# Patient Record
Sex: Female | Born: 1995 | Race: White | Hispanic: No | Smoking: Never smoker
Health system: Southern US, Community
[De-identification: ages and names within clinical notes are randomized; demographics above are authoritative.]

## PROBLEM LIST (undated history)

## (undated) DIAGNOSIS — Q825 Congenital non-neoplastic nevus: Secondary | ICD-10-CM

## (undated) DIAGNOSIS — K5909 Other constipation: Secondary | ICD-10-CM

## (undated) DIAGNOSIS — T7840XA Allergy, unspecified, initial encounter: Secondary | ICD-10-CM

## (undated) DIAGNOSIS — E559 Vitamin D deficiency, unspecified: Secondary | ICD-10-CM

## (undated) DIAGNOSIS — L309 Dermatitis, unspecified: Secondary | ICD-10-CM

## (undated) DIAGNOSIS — R6251 Failure to thrive (child): Secondary | ICD-10-CM

## (undated) HISTORY — PX: PEG TUBE PLACEMENT: SUR1034

## (undated) HISTORY — DX: Congenital non-neoplastic nevus: Q82.5

## (undated) HISTORY — DX: Dermatitis, unspecified: L30.9

## (undated) HISTORY — DX: Other constipation: K59.09

## (undated) HISTORY — DX: Allergy, unspecified, initial encounter: T78.40XA

## (undated) HISTORY — PX: OTHER SURGICAL HISTORY: SHX169

## (undated) HISTORY — DX: Failure to thrive (child): R62.51

## (undated) HISTORY — DX: Vitamin D deficiency, unspecified: E55.9

## (undated) HISTORY — DX: Hypercalcemia: E83.52

---
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
## 2017-07-28 DIAGNOSIS — Z23 Encounter for immunization: Secondary | ICD-10-CM | POA: Diagnosis not present

## 2019-01-23 ENCOUNTER — Other Ambulatory Visit: Payer: Self-pay

## 2019-01-23 ENCOUNTER — Encounter: Payer: Self-pay | Admitting: Family Medicine

## 2019-01-23 ENCOUNTER — Other Ambulatory Visit (HOSPITAL_COMMUNITY): Admission: RE | Admit: 2019-01-23 | Payer: BLUE CROSS/BLUE SHIELD | Source: Ambulatory Visit

## 2019-01-23 ENCOUNTER — Ambulatory Visit: Payer: BLUE CROSS/BLUE SHIELD | Admitting: Family Medicine

## 2019-01-23 VITALS — BP 118/68 | HR 86 | Temp 98.3°F | Resp 16 | Ht 61.0 in | Wt 142.4 lb

## 2019-01-23 DIAGNOSIS — Z124 Encounter for screening for malignant neoplasm of cervix: Secondary | ICD-10-CM

## 2019-01-23 DIAGNOSIS — Z01419 Encounter for gynecological examination (general) (routine) without abnormal findings: Secondary | ICD-10-CM

## 2019-01-23 DIAGNOSIS — Z131 Encounter for screening for diabetes mellitus: Secondary | ICD-10-CM

## 2019-01-23 DIAGNOSIS — R350 Frequency of micturition: Secondary | ICD-10-CM | POA: Diagnosis not present

## 2019-01-23 DIAGNOSIS — Z13220 Encounter for screening for lipoid disorders: Secondary | ICD-10-CM

## 2019-01-23 DIAGNOSIS — Z3040 Encounter for surveillance of contraceptives, unspecified: Secondary | ICD-10-CM

## 2019-01-23 MED ORDER — MEDROXYPROGESTERONE ACETATE 150 MG/ML IM SUSP: 150.0000 mg | INTRAMUSCULAR | 3 refills | Status: DC

## 2019-01-23 NOTE — Patient Instructions (Signed)
Preventive Care 21-21 Years Old, Female Preventive care refers to visits with your health care provider and lifestyle choices that can promote health and wellness. This includes:  A yearly physical exam. This may also be called an annual well check.  Regular dental visits and eye exams.  Immunizations.  Screening for certain conditions.  Healthy lifestyle choices, such as eating a healthy diet, getting regular exercise, not using drugs or products that contain nicotine and tobacco, and limiting alcohol use. What can I expect for my preventive care visit? Physical exam Your health care provider will check your:  Height and weight. This may be used to calculate body mass index (BMI), which tells if you are at a healthy weight.  Heart rate and blood pressure.  Skin for abnormal spots. Counseling Your health care provider may ask you questions about your:  Alcohol, tobacco, and drug use.  Emotional well-being.  Home and relationship well-being.  Sexual activity.  Eating habits.  Work and work environment.  Method of birth control.  Menstrual cycle.  Pregnancy history. What immunizations do I need?  Influenza (flu) vaccine  This is recommended every year. Tetanus, diphtheria, and pertussis (Tdap) vaccine  You may need a Td booster every 10 years. Varicella (chickenpox) vaccine  You may need this if you have not been vaccinated. Human papillomavirus (HPV) vaccine  If recommended by your health care provider, you may need three doses over 6 months. Measles, mumps, and rubella (MMR) vaccine  You may need at least one dose of MMR. You may also need a second dose. Meningococcal conjugate (MenACWY) vaccine  One dose is recommended if you are age 19-21 years and a first-year college student living in a residence hall, or if you have one of several medical conditions. You may also need additional booster doses. Pneumococcal conjugate (PCV13) vaccine  You may need  this if you have certain conditions and were not previously vaccinated. Pneumococcal polysaccharide (PPSV23) vaccine  You may need one or two doses if you smoke cigarettes or if you have certain conditions. Hepatitis A vaccine  You may need this if you have certain conditions or if you travel or work in places where you may be exposed to hepatitis A. Hepatitis B vaccine  You may need this if you have certain conditions or if you travel or work in places where you may be exposed to hepatitis B. Haemophilus influenzae type b (Hib) vaccine  You may need this if you have certain conditions. You may receive vaccines as individual doses or as more than one vaccine together in one shot (combination vaccines). Talk with your health care provider about the risks and benefits of combination vaccines. What tests do I need?  Blood tests  Lipid and cholesterol levels. These may be checked every 5 years starting at age 20.  Hepatitis C test.  Hepatitis B test. Screening  Diabetes screening. This is done by checking your blood sugar (glucose) after you have not eaten for a while (fasting).  Sexually transmitted disease (STD) testing.  BRCA-related cancer screening. This may be done if you have a family history of breast, ovarian, tubal, or peritoneal cancers.  Pelvic exam and Pap test. This may be done every 3 years starting at age 21. Starting at age 30, this may be done every 5 years if you have a Pap test in combination with an HPV test. Talk with your health care provider about your test results, treatment options, and if necessary, the need for more tests.   Follow these instructions at home: Eating and drinking   Eat a diet that includes fresh fruits and vegetables, whole grains, lean protein, and low-fat dairy.  Take vitamin and mineral supplements as recommended by your health care provider.  Do not drink alcohol if: ? Your health care provider tells you not to drink. ? You are  pregnant, may be pregnant, or are planning to become pregnant.  If you drink alcohol: ? Limit how much you have to 0-1 drink a day. ? Be aware of how much alcohol is in your drink. In the [place] one drink equals one 12 oz bottle of beer (355 mL), one 5 oz glass of wine (148 mL), or one 1 oz glass of hard liquor (44 mL). Lifestyle  Take daily care of your teeth and gums.  Stay active. Exercise for at least 30 minutes on 5 or more days each week.  Do not use any products that contain nicotine or tobacco, such as cigarettes, e-cigarettes, and chewing tobacco. If you need help quitting, ask your health care provider.  If you are sexually active, practice safe sex. Use a condom or other form of birth control (contraception) in order to prevent pregnancy and STIs (sexually transmitted infections). If you plan to become pregnant, see your health care provider for a preconception visit. What's next?  Visit your health care provider once a year for a well check visit.  Ask your health care provider how often you should have your eyes and teeth checked.  Stay up to date on all vaccines. This information is not intended to replace advice given to you by your health care provider. Make sure you discuss any questions you have with your health care provider. Document Released: 09/11/2001 Document Revised: 03/27/2018 Document Reviewed: 03/27/2018 Elsevier Patient Education  2020 Elsevier Inc.  

## 2019-01-23 NOTE — Progress Notes (Signed)
Name: [facility]   MRN: [id number]    DOB: [birth year]   Date:01/23/2019       Progress Note  Subjective  Chief Complaint  Chief Complaint  Patient presents with  . Contraception    HPI   Patient presents for annual CPE   Diet: diet is not balanced mostly fast food for lunch and dinner, discussed healthy diet  Exercise: not physical active   USPSTF grade A and B recommendations    [facility] Visit from [facility]/23/2019 in [facility]  AUDIT-C Score  5     Depression: Phq 9 is  negative Depression screen [facility] 2/9 01/23/2019 08/01/2016 11/29/2015 06/22/2015 06/13/2015  Decreased Interest 0 0 0 0 0  Down, Depressed, Hopeless 0 0 0 0 0  PHQ - 2 Score 0 0 0 0 0  Altered sleeping 0 - - - -  Tired, decreased energy 0 - - - -  Change in appetite 0 - - - -  Feeling bad or failure about yourself  0 - - - -  Trouble concentrating 0 - - - -  Moving slowly or fidgety/restless 0 - - - -  Suicidal thoughts 0 - - - -  PHQ-9 Score 0 - - - -  Difficult doing work/chores Not difficult at all - - - -   Hypertension: BP Readings from Last 3 Encounters:  01/23/19 118/68  08/01/16 114/76  11/29/15 (!) 98/52 (11 %, Z = -1.23 /  9 %, Z = -1.32)*   *BP percentiles are based on the 2017 AAP Clinical Practice Guideline for girls   Obesity: Wt Readings from Last 3 Encounters:  01/23/19 142 lb 6.4 oz (64.6 kg)  08/01/16 114 lb 14.4 oz (52.1 kg) (28 %, Z= -0.58)*  11/29/15 107 lb 8 oz (48.8 kg) (16 %, Z= -0.99)*   * Growth percentiles are based on CDC (Girls, 2-20 Years) data.   BMI Readings from Last 3 Encounters:  01/23/19 26.91 kg/m  08/01/16 21.71 kg/m (53 %, Z= 0.09)*  11/29/15 20.31 kg/m (38 %, Z= -0.31)*   * Growth percentiles are based on CDC (Girls, 2-20 Years) data.    Hep C Screening: refused STD testing and prevention (HIV/chl/gon/syphilis): just GC today , she is worried about cost of test, advised to go to free testing site  Intimate partner violence:  negative screen  Sexual History/Pain during Intercourse: no pain or bleeding with intercourse  Menstrual History/LMP/Abnormal Bleeding: menarche age 20, regular cycles, used to be on Depo and would like to resume it  Incontinence Symptoms: she has urinary urgency lately and would like to be checked for an uti   Advanced Care Planning: A voluntary discussion about advance care planning including the explanation and discussion of advance directives.  Discussed health care proxy and Living will, and the patient was able to identify a health care proxy as mother .     BRCA gene screening: discussed and she will think about it, she was adopted  Cervical cancer screening: today   Osteoporosis Screening: discussed high calcium and vitamin D   Glucose:  Glucose  Date Value Ref Range Status  04/07/2015 91 65 - 99 mg/dL Final   Glucose, Bld  Date Value Ref Range Status  11/23/2015 84 65 - 99 mg/dL Final    Skin cancer: discussed atypical lesions    Patient Active Problem List   Diagnosis Date Noted  . Syncope and collapse 12/15/2015  . Chronic constipation 01/10/2015  . Dermatitis, eczematoid  01/10/2015  . Perennial allergic rhinitis 01/10/2015  . Vitamin D deficiency 01/10/2015  . H/O urinary disorder 02/02/2008    Past Surgical History:  Procedure Laterality Date  . cranial synostosis repair  2001  . PEG TUBE PLACEMENT      Family History  Adopted: Yes    Social History   Socioeconomic History  . Marital status: Single    Spouse name: Not on file  . Number of children: 0  . Years of education: 24  . Highest education level: Some college, no degree  Occupational History  . Occupation: fast Therapist, occupational   Social Needs  . Financial resource strain: Not hard at all  . Food insecurity    Worry: Never true    Inability: Never true  . Transportation needs    Medical: No    Non-medical: No  Tobacco Use  . Smoking status: Never Smoker  . Smokeless tobacco: Never Used   Substance and Sexual Activity  . Alcohol use: Yes    Alcohol/week: 3.0 standard drinks    Types: 3 Standard drinks or equivalent per week  . Drug use: Yes    Frequency: 2.0 times per week    Types: Marijuana  . Sexual activity: Yes    Partners: Male    Birth control/protection: Condom  Lifestyle  . Physical activity    Days per week: 0 days    Minutes per session: 0 min  . Stress: Not at all  Relationships  . Social connections    Talks on phone: More than three times a week    Gets together: More than three times a week    Attends religious service: Never    Active member of club or organization: Yes    Attends meetings of clubs or organizations: More than 4 times per year    Relationship status: Never married  . Intimate partner violence    Fear of current or ex partner: No    Emotionally abused: No    Physically abused: No    Forced sexual activity: No  Other Topics Concern  . Not on file  Social History Narrative   Graduates next May 2021 from [employer] in [place].    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  [employer]S VITAMIN D 2000 UNITS CAPS, Take 1 capsule by mouth daily. Reported on 11/29/2015, Disp: , Rfl: 5 .  fluticasone (FLONASE) 50 MCG/ACT nasal spray, Place 2 sprays into both nostrils at bedtime. Reported on 11/29/2015, Disp: , Rfl: 0 .  loratadine (CLARITIN) 10 MG tablet, Take 10 mg by mouth daily. Reported on 11/29/2015, Disp: , Rfl: 2 .  medroxyPROGESTERone (DEPO-PROVERA) 150 MG/ML injection, Inject 1 mL (150 mg total) into the muscle every 3 (three) months., Disp: 1 mL, Rfl: 3  No Known Allergies   ROS  Constitutional: Negative for fever or weight change.  Respiratory: Negative for cough and shortness of breath.   Cardiovascular: Negative for chest pain or palpitations.  Gastrointestinal: Negative for abdominal pain, no bowel changes.  Musculoskeletal: Negative for gait problem or joint swelling.  Skin:  Negative for rash.  Neurological: Negative for dizziness or headache.  No other specific complaints in a complete review of systems (except as listed in HPI above).  Objective  Vitals:   01/23/19 1528  BP: 118/68  Pulse: 86  Resp: 16  Temp: 98.3 F (36.8 C)  TempSrc: Oral  SpO2: 98%  Weight: 142 lb  6.4 oz (64.6 kg)  Height: '5\' 1"'  (1.549 m)    Body mass index is 26.91 kg/m.  Physical Exam  Constitutional: Patient appears well-developed and well-nourished. No distress.  HENT: Head: Normocephalic and atraumatic. Ears: B TMs ok, no erythema or effusion; Nose: Nose normal. Mouth/Throat: Oropharynx is clear and moist. No oropharyngeal exudate.  Eyes: Conjunctivae and EOM are normal. Pupils are equal, round, and reactive to light. No scleral icterus.  Neck: Normal range of motion. Neck supple. No JVD present. No thyromegaly present.  Cardiovascular: Normal rate, regular rhythm and normal heart sounds.  No murmur heard. No BLE edema. Pulmonary/Chest: Effort normal and breath sounds normal. No respiratory distress. Abdominal: Soft. Bowel sounds are normal, no distension. There is no tenderness. no masses Breast: no lumps or masses, no nipple discharge or rashes FEMALE GENITALIA:  External genitalia normal External urethra normal Vaginal vault normal without discharge or lesions Cervix normal without discharge or lesions Bimanual exam normal without masses RECTAL: not  done  Musculoskeletal: Normal range of motion, no joint effusions. No gross deformities Neurological: he is alert and oriented to person, place, and time. No cranial nerve deficit. Coordination, balance, strength, speech and gait are normal.  Skin: Skin is warm and dry. No rash noted. No erythema.  Psychiatric: Patient has a normal mood and affect. behavior is normal. Judgment and thought content normal.  PHQ2/9: Depression screen [facility] 2/9 01/23/2019 08/01/2016 11/29/2015 06/22/2015 06/13/2015  Decreased Interest 0 0 0 0  0  Down, Depressed, Hopeless 0 0 0 0 0  PHQ - 2 Score 0 0 0 0 0  Altered sleeping 0 - - - -  Tired, decreased energy 0 - - - -  Change in appetite 0 - - - -  Feeling bad or failure about yourself  0 - - - -  Trouble concentrating 0 - - - -  Moving slowly or fidgety/restless 0 - - - -  Suicidal thoughts 0 - - - -  PHQ-9 Score 0 - - - -  Difficult doing work/chores Not difficult at all - - - -     Fall Risk: Fall Risk  01/23/2019 08/01/2016 11/29/2015 06/22/2015 06/13/2015  Falls in the past year? 0 No No No No  Number falls in past yr: 0 - - - -  Injury with Fall? 0 - - - -     Functional Status Survey: Is the patient deaf or have difficulty hearing?: No Does the patient have difficulty seeing, even when wearing glasses/contacts?: No Does the patient have difficulty concentrating, remembering, or making decisions?: No Does the patient have difficulty walking or climbing stairs?: No Does the patient have difficulty dressing or bathing?: No Does the patient have difficulty doing errands alone such as visiting a doctor's office or shopping?: No  Assessment & Plan   1. Well woman exam  - Lipid panel - Hemoglobin A1c - CBC with Differential/Platelet - COMPLETE METABOLIC PANEL WITH GFR  2. Cervical cancer screening  - Cytology - PAP  3. Screening cholesterol level  - Hemoglobin A1c  4. Diabetes mellitus screening  - Lipid panel  5. Encounter for surveillance of contraceptives  - medroxyPROGESTERone (DEPO-PROVERA) 150 MG/ML injection; Inject 1 mL (150 mg total) into the muscle every 3 (three) months.  Dispense: 1 mL; Refill: 3  6. Urinary frequency  - CULTURE, URINE COMPREHENSIVE  -USPSTF grade A and B recommendations reviewed with patient; age-appropriate recommendations, preventive care, screening tests, etc discussed and encouraged; healthy living encouraged; see AVS  for patient education given to patient -Discussed importance of 150 minutes of physical activity  weekly, eat two servings of fish weekly, eat one serving of tree nuts ( cashews, pistachios, pecans, almonds.Marland Kitchen) every other day, eat 6 servings of fruit/vegetables daily and drink plenty of water and avoid sweet beverages.

## 2019-01-25 LAB — CBC WITH DIFFERENTIAL/PLATELET
Absolute Monocytes: 1062 cells/uL — ABNORMAL HIGH (ref 200–950)
Basophils Absolute: 77 cells/uL (ref 0–200)
Basophils Relative: 0.6 %
Eosinophils Absolute: 154 cells/uL (ref 15–500)
Eosinophils Relative: 1.2 %
HCT: 42.4 % (ref 35.0–45.0)
Hemoglobin: 14.4 g/dL (ref 11.7–15.5)
Lymphs Abs: 3046 cells/uL (ref 850–3900)
MCH: 30.4 pg (ref 27.0–33.0)
MCHC: 34 g/dL (ref 32.0–36.0)
MCV: 89.6 fL (ref 80.0–100.0)
MPV: 11.2 fL (ref 7.5–12.5)
Monocytes Relative: 8.3 %
Neutro Abs: 8461 cells/uL — ABNORMAL HIGH (ref 1500–7800)
Neutrophils Relative %: 66.1 %
Platelets: 321 10*3/uL (ref 140–400)
RBC: 4.73 10*6/uL (ref 3.80–5.10)
RDW: 11.4 % (ref 11.0–15.0)
Total Lymphocyte: 23.8 %
WBC: 12.8 10*3/uL — ABNORMAL HIGH (ref 3.8–10.8)

## 2019-01-25 LAB — CULTURE, URINE COMPREHENSIVE
MICRO NUMBER:: 612507
SPECIMEN QUALITY:: ADEQUATE

## 2019-01-25 LAB — COMPLETE METABOLIC PANEL WITH GFR
AG Ratio: 1.8 (calc) (ref 1.0–2.5)
ALT: 14 U/L (ref 6–29)
AST: 15 U/L (ref 10–30)
Albumin: 4.7 g/dL (ref 3.6–5.1)
Alkaline phosphatase (APISO): 60 U/L (ref 31–125)
BUN: 11 mg/dL (ref 7–25)
CO2: 23 mmol/L (ref 20–32)
Calcium: 9.7 mg/dL (ref 8.6–10.2)
Chloride: 106 mmol/L (ref 98–110)
Creat: 0.84 mg/dL (ref 0.50–1.10)
GFR, Est African American: 115 mL/min/{1.73_m2} (ref >=60)
GFR, Est Non African American: 99 mL/min/{1.73_m2} (ref >=60)
Globulin: 2.6 g/dL (calc) (ref 1.9–3.7)
Glucose, Bld: 77 mg/dL (ref 65–99)
Potassium: 4.1 mmol/L (ref 3.5–5.3)
Sodium: 139 mmol/L (ref 135–146)
Total Bilirubin: 0.5 mg/dL (ref 0.2–1.2)
Total Protein: 7.3 g/dL (ref 6.1–8.1)

## 2019-01-25 LAB — LIPID PANEL
Cholesterol: 155 mg/dL (ref <200)
HDL: 65 mg/dL (ref >=50)
LDL Cholesterol (Calc): 76 mg/dL (calc)
Non-HDL Cholesterol (Calc): 90 mg/dL (calc) (ref <130)
Total CHOL/HDL Ratio: 2.4 (calc) (ref <5.0)
Triglycerides: 64 mg/dL (ref <150)

## 2019-01-25 LAB — HEMOGLOBIN A1C
Hgb A1c MFr Bld: 4.8 % of total Hgb (ref <5.7)
Mean Plasma Glucose: 91 (calc)
eAG (mmol/L): 5 (calc)

## 2019-01-26 ENCOUNTER — Other Ambulatory Visit: Payer: Self-pay | Admitting: Family Medicine

## 2019-01-26 DIAGNOSIS — D72829 Elevated white blood cell count, unspecified: Secondary | ICD-10-CM

## 2019-01-26 DIAGNOSIS — R350 Frequency of micturition: Secondary | ICD-10-CM | POA: Diagnosis not present

## 2019-01-27 LAB — CYTOLOGY - PAP
Chlamydia: NEGATIVE
Diagnosis: NEGATIVE
Neisseria Gonorrhea: NEGATIVE

## 2019-01-28 LAB — CULTURE, URINE COMPREHENSIVE
MICRO NUMBER:: 619490
SPECIMEN QUALITY:: ADEQUATE

## 2019-02-03 ENCOUNTER — Other Ambulatory Visit: Payer: Self-pay | Admitting: Family Medicine

## 2019-02-03 DIAGNOSIS — D72829 Elevated white blood cell count, unspecified: Secondary | ICD-10-CM | POA: Diagnosis not present

## 2019-02-03 LAB — CBC WITH DIFFERENTIAL/PLATELET
Absolute Monocytes: 446 cells/uL (ref 200–950)
Basophils Absolute: 29 cells/uL (ref 0–200)
Basophils Relative: 0.6 %
Eosinophils Absolute: 59 cells/uL (ref 15–500)
Eosinophils Relative: 1.2 %
HCT: 36.7 % (ref 35.0–45.0)
Hemoglobin: 12.5 g/dL (ref 11.7–15.5)
Lymphs Abs: 1960 cells/uL (ref 850–3900)
MCH: 31 pg (ref 27.0–33.0)
MCHC: 34.1 g/dL (ref 32.0–36.0)
MCV: 91.1 fL (ref 80.0–100.0)
MPV: 12 fL (ref 7.5–12.5)
Monocytes Relative: 9.1 %
Neutro Abs: 2406 cells/uL (ref 1500–7800)
Neutrophils Relative %: 49.1 %
Platelets: 243 10*3/uL (ref 140–400)
RBC: 4.03 10*6/uL (ref 3.80–5.10)
RDW: 12 % (ref 11.0–15.0)
Total Lymphocyte: 40 %
WBC: 4.9 10*3/uL (ref 3.8–10.8)

## 2019-03-03 ENCOUNTER — Ambulatory Visit: Payer: BC Managed Care – PPO | Admitting: Family Medicine

## 2019-07-17 ENCOUNTER — Encounter: Payer: Self-pay | Admitting: Family Medicine

## 2019-07-17 ENCOUNTER — Other Ambulatory Visit: Payer: Self-pay

## 2019-07-17 ENCOUNTER — Ambulatory Visit: Payer: BC Managed Care – PPO

## 2019-07-17 DIAGNOSIS — Z3042 Encounter for surveillance of injectable contraceptive: Secondary | ICD-10-CM | POA: Diagnosis not present

## 2019-07-17 MED ORDER — MEDROXYPROGESTERONE ACETATE 150 MG/ML IM SUSP
150.0000 mg | INTRAMUSCULAR | Status: DC
  Administered 2019-07-17: 150 mg via INTRAMUSCULAR

## 2019-07-17 NOTE — Progress Notes (Signed)
Patient is here for her depo injection. Her last injection was done on September 29 at [facility]. She is on time for next injection no urine pregnancy test was needed.  Her next injection will be due March 5-19. She tolerated injection well.

## 2019-10-16 ENCOUNTER — Ambulatory Visit (INDEPENDENT_AMBULATORY_CARE_PROVIDER_SITE_OTHER): Payer: BC Managed Care – PPO

## 2019-10-16 ENCOUNTER — Other Ambulatory Visit: Payer: Self-pay

## 2019-10-16 DIAGNOSIS — Z3042 Encounter for surveillance of injectable contraceptive: Secondary | ICD-10-CM

## 2019-10-16 MED ORDER — MEDROXYPROGESTERONE ACETATE 150 MG/ML IM SUSY
150.0000 mg | PREFILLED_SYRINGE | INTRAMUSCULAR | Status: AC
Start: 2019-10-16 — End: 2019-10-16
  Administered 2019-10-16: 150 mg via INTRAMUSCULAR

## 2019-10-16 NOTE — Progress Notes (Signed)
Patient is here for her depo injection. Her last injection was done on July 17, 2019 at [facility] . She is on time for next injection no urine pregnancy test was needed.  Her next injection will be due June 4-June 18 She tolerated injection well.

## 2020-01-04 ENCOUNTER — Other Ambulatory Visit: Payer: Self-pay | Admitting: Family Medicine

## 2020-01-04 DIAGNOSIS — Z3040 Encounter for surveillance of contraceptives, unspecified: Secondary | ICD-10-CM

## 2020-01-07 ENCOUNTER — Ambulatory Visit: Payer: BC Managed Care – PPO

## 2020-01-07 ENCOUNTER — Ambulatory Visit: Payer: Self-pay

## 2020-01-08 ENCOUNTER — Ambulatory Visit (INDEPENDENT_AMBULATORY_CARE_PROVIDER_SITE_OTHER): Payer: BC Managed Care – PPO

## 2020-01-08 ENCOUNTER — Other Ambulatory Visit: Payer: Self-pay

## 2020-01-08 DIAGNOSIS — Z3042 Encounter for surveillance of injectable contraceptive: Secondary | ICD-10-CM

## 2020-01-08 MED ORDER — MEDROXYPROGESTERONE ACETATE 150 MG/ML IM SUSP
150.0000 mg | Freq: Once | INTRAMUSCULAR | Status: AC
  Administered 2020-01-08: 150 mg via INTRAMUSCULAR

## 2020-01-21 ENCOUNTER — Other Ambulatory Visit: Payer: Self-pay | Admitting: Family Medicine

## 2020-01-21 DIAGNOSIS — D72829 Elevated white blood cell count, unspecified: Secondary | ICD-10-CM

## 2020-01-30 ENCOUNTER — Encounter: Payer: Self-pay | Admitting: Emergency Medicine

## 2020-01-30 ENCOUNTER — Emergency Department: Payer: BC Managed Care – PPO

## 2020-01-30 ENCOUNTER — Other Ambulatory Visit: Payer: Self-pay

## 2020-01-30 DIAGNOSIS — W1839XA Other fall on same level, initial encounter: Secondary | ICD-10-CM | POA: Diagnosis not present

## 2020-01-30 DIAGNOSIS — Y999 Unspecified external cause status: Secondary | ICD-10-CM | POA: Insufficient documentation

## 2020-01-30 DIAGNOSIS — Z5321 Procedure and treatment not carried out due to patient leaving prior to being seen by health care provider: Secondary | ICD-10-CM | POA: Diagnosis not present

## 2020-01-30 DIAGNOSIS — S0993XA Unspecified injury of face, initial encounter: Secondary | ICD-10-CM | POA: Diagnosis not present

## 2020-01-30 DIAGNOSIS — Y939 Activity, unspecified: Secondary | ICD-10-CM | POA: Insufficient documentation

## 2020-01-30 DIAGNOSIS — S0990XA Unspecified injury of head, initial encounter: Secondary | ICD-10-CM | POA: Diagnosis not present

## 2020-01-30 DIAGNOSIS — Y929 Unspecified place or not applicable: Secondary | ICD-10-CM | POA: Diagnosis not present

## 2020-01-30 DIAGNOSIS — S80211A Abrasion, right knee, initial encounter: Secondary | ICD-10-CM | POA: Insufficient documentation

## 2020-01-30 DIAGNOSIS — S0081XA Abrasion of other part of head, initial encounter: Secondary | ICD-10-CM | POA: Diagnosis not present

## 2020-01-30 DIAGNOSIS — S199XXA Unspecified injury of neck, initial encounter: Secondary | ICD-10-CM | POA: Diagnosis not present

## 2020-01-30 DIAGNOSIS — S80212A Abrasion, left knee, initial encounter: Secondary | ICD-10-CM | POA: Diagnosis not present

## 2020-01-30 LAB — CBC
HCT: 40.5 % (ref 36.0–46.0)
Hemoglobin: 14 g/dL (ref 12.0–15.0)
MCH: 30.2 pg (ref 26.0–34.0)
MCHC: 34.6 g/dL (ref 30.0–36.0)
MCV: 87.3 fL (ref 80.0–100.0)
Platelets: 341 10*3/uL (ref 150–400)
RBC: 4.64 MIL/uL (ref 3.87–5.11)
RDW: 11.5 % (ref 11.5–15.5)
WBC: 11.6 10*3/uL — ABNORMAL HIGH (ref 4.0–10.5)
nRBC: 0 % (ref 0.0–0.2)

## 2020-01-30 LAB — BASIC METABOLIC PANEL
Anion gap: 10 (ref 5–15)
BUN: 12 mg/dL (ref 6–20)
CO2: 20 mmol/L — ABNORMAL LOW (ref 22–32)
Calcium: 9.4 mg/dL (ref 8.9–10.3)
Chloride: 106 mmol/L (ref 98–111)
Creatinine, Ser: 0.85 mg/dL (ref 0.44–1.00)
GFR calc Af Amer: >60 mL/min (ref >60)
GFR calc non Af Amer: >60 mL/min (ref >60)
Glucose, Bld: 94 mg/dL (ref 70–99)
Potassium: 3.5 mmol/L (ref 3.5–5.1)
Sodium: 136 mmol/L (ref 135–145)

## 2020-01-30 LAB — GLUCOSE, CAPILLARY: Glucose-Capillary: 87 mg/dL (ref 70–99)

## 2020-01-30 NOTE — ED Triage Notes (Addendum)
Pt arrived via POV with reports of hanging out with friends today, states she drank 2 glasses of wine, prior to arrival she states she passed out and fell forward while standing on cement. Pt has abrasions to forehead and bilateral knees.  Denies any vomiting or nausea.  Pt alert and oriented at this time, speech clear.

## 2020-01-30 NOTE — ED Notes (Signed)
Patient transported to CT 

## 2020-01-30 NOTE — ED Notes (Signed)
Pt unable to give urine sample at this time; pt has specimen cup. Gave pt ice pack for head.

## 2020-01-30 NOTE — ED Notes (Signed)
Pt told screener that she would be leaving and did not want to stay for further treatment at this time. Pt ambulatory out to parking lot without difficulty.

## 2020-01-31 ENCOUNTER — Emergency Department
Admission: EM | Admit: 2020-01-31 | Discharge: 2020-01-31 | Disposition: A | Discharge status: Home / Self Care | Payer: BC Managed Care – PPO | Attending: Emergency Medicine | Admitting: Emergency Medicine

## 2020-03-31 ENCOUNTER — Telehealth: Payer: Self-pay | Admitting: Family Medicine

## 2020-03-31 DIAGNOSIS — Z3040 Encounter for surveillance of contraceptives, unspecified: Secondary | ICD-10-CM

## 2020-03-31 NOTE — Telephone Encounter (Signed)
Requested  medications are  due for refill today yes  Requested medications are on the active medication list yes  Last refill 6/7  Last visit June 2020  Future visit scheduled No  Notes to clinic failed protocol of visit within 12 months

## 2020-04-06 NOTE — Telephone Encounter (Signed)
lvm informing pt that prescription has been sent to pharmacy also for pt to schedule an appt. Will try to get her in next Thursday if pt call back in time

## 2020-08-22 NOTE — Progress Notes (Unsigned)
Name: Sandra Hayes [facility]   MRN: [id number]    DOB: 04-27-1996   Date:08/23/2020       Progress Note  Subjective  Chief Complaint  Chief Complaint  Patient presents with  . Sore Throat  . Nasal Congestion  . Contraception    HPI  Patient presents for annual CPE.  Diet: balanced and enough calcium Exercise: physical job  Haematologist Visit from 08/23/2020 in [facility]  AUDIT-C Score 0     Depression: Phq 9 is  negative Depression screen [facility] 2/9 08/23/2020 01/23/2019 08/01/2016 11/29/2015 06/22/2015  Decreased Interest 0 0 0 0 0  Down, Depressed, Hopeless 0 0 0 0 0  PHQ - 2 Score 0 0 0 0 0  Altered sleeping - 0 - - -  Tired, decreased energy - 0 - - -  Change in appetite - 0 - - -  Feeling bad or failure about yourself  - 0 - - -  Trouble concentrating - 0 - - -  Moving slowly or fidgety/restless - 0 - - -  Suicidal thoughts - 0 - - -  PHQ-9 Score - 0 - - -  Difficult doing work/chores - Not difficult at all - - -   Hypertension: BP Readings from Last 3 Encounters:  08/23/20 90/62  01/30/20 139/78  01/23/19 118/68   Obesity: Wt Readings from Last 3 Encounters:  08/23/20 119 lb 8 oz (54.2 kg)  01/30/20 140 lb (63.5 kg)  01/23/19 142 lb 6.4 oz (64.6 kg)   BMI Readings from Last 3 Encounters:  08/23/20 21.86 kg/m  01/30/20 25.61 kg/m  01/23/19 26.91 kg/m     Vaccines:   Tdap today  Flu: today   Hep C Screening: today  STD testing and prevention (HIV/chl/gon/syphilis): today  Intimate partner violence:negative screen  Sexual History : same sexual partner, female, past year  Menstrual History/LMP/Abnormal Bleeding: taking depo , last dose 4 months ago, pregnancy test today  Incontinence Symptoms: none    Osteoporosis: Discussed high calcium and vitamin D supplementation, weight bearing exercises  Cervical cancer screening: today   Skin cancer: Discussed monitoring for atypical lesions   Advanced Care Planning: A voluntary  discussion about advance care planning including the explanation and discussion of advance directives.  Discussed health care proxy and Living will, and the patient was able to identify a health care proxy as mother .  Patient does not have a living will at present time.  Lipids: Lab Results  Component Value Date   CHOL 155 01/23/2019   CHOL 121 04/07/2015   Lab Results  Component Value Date   HDL 65 01/23/2019   Lab Results  Component Value Date   LDLCALC 76 01/23/2019   Lab Results  Component Value Date   TRIG 64 01/23/2019   Lab Results  Component Value Date   CHOLHDL 2.4 01/23/2019   No results found for: LDLDIRECT  Glucose: Glucose, Bld  Date Value Ref Range Status  01/30/2020 94 70 - 99 mg/dL Final    Comment:    Glucose reference range applies only to samples taken after fasting for at least 8 hours.  01/23/2019 77 65 - 99 mg/dL Final    Comment:    .            Fasting reference interval .   11/23/2015 84 65 - 99 mg/dL Final   Glucose-Capillary  Date Value Ref Range Status  01/30/2020 87 70 - 99 mg/dL Final  Comment:    Glucose reference range applies only to samples taken after fasting for at least 8 hours.    Patient Active Problem List   Diagnosis Date Noted  . Syncope and collapse 12/15/2015  . Chronic constipation 01/10/2015  . Dermatitis, eczematoid 01/10/2015  . Perennial allergic rhinitis 01/10/2015  . Vitamin D deficiency 01/10/2015  . H/O urinary disorder 02/02/2008    Past Surgical History:  Procedure Laterality Date  . cranial synostosis repair  2001  . PEG TUBE PLACEMENT      Family History  Adopted: Yes    Social History   Socioeconomic History  . Marital status: Media planner    Spouse name: Not on file  . Number of children: 0  . Years of education: 64  . Highest education level: Some college, no degree  Occupational History  . Occupation: wear house stocker   Tobacco Use  . Smoking status: Never Smoker  .  Smokeless tobacco: Never Used  Vaping Use  . Vaping Use: Never used  Substance and Sexual Activity  . Alcohol use: Yes    Alcohol/week: 3.0 standard drinks    Types: 3 Standard drinks or equivalent per week  . Drug use: Yes    Frequency: 2.0 times per week    Types: Marijuana  . Sexual activity: Yes    Partners: Male    Birth control/protection: Condom, Injection  Other Topics Concern  . Not on file  Social History Narrative   Graduates next May 2021 from [employer] in [place].    Applying for [employer], wants to do Speech pathology at [employer]   Social Determinants of Health   Financial Resource Strain: Not on file  Food Insecurity: No Food Insecurity  . Worried About Programme researcher, broadcasting/film/video in the Last Year: Never true  . Ran Out of Food in the Last Year: Never true  Transportation Needs: No Transportation Needs  . Lack of Transportation (Medical): No  . Lack of Transportation (Non-Medical): No  Physical Activity: Sufficiently Active  . Days of Exercise per Week: 5 days  . Minutes of Exercise per Session: 150+ min  Stress: Not on file  Social Connections: Not on file  Intimate Partner Violence: Not At Risk  . Fear of Current or Ex-Partner: No  . Emotionally Abused: No  . Physically Abused: No  . Sexually Abused: No     Current Outpatient Medications:  .  [employer] VITAMIN D 2000 UNITS CAPS, Take 1 capsule by mouth daily. Reported on 11/29/2015 (Patient not taking: Reported on 08/23/2020), Disp: , Rfl: 5 .  medroxyPROGESTERone (DEPO-PROVERA) 150 MG/ML injection, Inject 1 mL (150 mg total) into the muscle every 3 (three) months., Disp: 1 mL, Rfl: 3  No Known Allergies   ROS  Constitutional: Negative for fever or weight change.  Respiratory: Negative for cough and shortness of breath.   Cardiovascular: Negative for chest pain or palpitations.  Gastrointestinal: Negative for abdominal pain, no bowel changes.  Musculoskeletal: Negative for gait problem or joint  swelling.  Skin: Negative for rash.  Neurological: Negative for dizziness or headache.  No other specific complaints in a complete review of systems (except as listed in HPI above).  Objective  Vitals:   08/23/20 1653  BP: 90/62  Pulse: 74  Resp: 14  Temp: 97.7 F (36.5 C)  TempSrc: Oral  SpO2: 98%  Weight: 119 lb 8 oz (54.2 kg)  Height: 5\' 2"  (1.575 m)    Body mass index is 21.86  kg/m.  Physical Exam  Constitutional: Patient appears well-developed and well-nourished. No distress.  HENT: Head: Normocephalic and atraumatic. Ears: B TMs ok, no erythema or effusion; Nose: Nose normal. Mouth/Throat: Oropharynx is clear and moist. No oropharyngeal exudate.  Eyes: Conjunctivae and EOM are normal. Pupils are equal, round, and reactive to light. No scleral icterus.  Neck: Normal range of motion. Neck supple. No JVD present. No thyromegaly present.  Cardiovascular: Normal rate, regular rhythm and normal heart sounds.  No murmur heard. No BLE edema. Pulmonary/Chest: Effort normal and breath sounds normal. No respiratory distress. Abdominal: Soft. Bowel sounds are normal, no distension. There is no tenderness. no masses Breast: pea size lump around 11 o'clock position  no nipple discharge or rashes FEMALE GENITALIA:  External genitalia normal External urethra normal Vaginal vault normal without discharge or lesions Cervix normal without discharge or lesions Bimanual exam normal without masses RECTAL: not done  Musculoskeletal: Normal range of motion, no joint effusions. No gross deformities Neurological: he is alert and oriented to person, place, and time. No cranial nerve deficit. Coordination, balance, strength, speech and gait are normal.  Skin: Skin is warm and dry. No rash noted. No erythema.  Psychiatric: Patient has a normal mood and affect. behavior is normal. Judgment and thought content normal.  Fall Risk: Fall Risk  08/23/2020 01/23/2019 08/01/2016 11/29/2015 06/22/2015   Falls in the past year? 0 0 No No No  Number falls in past yr: 0 0 - - -  Injury with Fall? 0 0 - - -     Functional Status Survey: Is the patient deaf or have difficulty hearing?: No Does the patient have difficulty seeing, even when wearing glasses/contacts?: No Does the patient have difficulty concentrating, remembering, or making decisions?: No Does the patient have difficulty walking or climbing stairs?: No Does the patient have difficulty dressing or bathing?: No Does the patient have difficulty doing errands alone such as visiting a doctor's office or shopping?: No   Assessment & Plan  1. Well adult exam   2. Needs flu shot  - Flu Vaccine QUAD 6+ mos PF IM (Fluarix Quad PF)  3. Need for Tdap vaccination  - Tdap vaccine greater than or equal to 7yo IM  4. Viral illness  Day 10 with very mild symptoms.   5. Encounter for surveillance of contraceptives  - medroxyPROGESTERone (DEPO-PROVERA) 150 MG/ML injection; Inject 1 mL (150 mg total) into the muscle every 3 (three) months.  Dispense: 1 mL; Refill: 3  6. Screening cholesterol level  - Lipid panel  7. Diabetes mellitus screening  - Hemoglobin A1c  8. Long-term use of high-risk medication  - COMPLETE METABOLIC PANEL WITH GFR - CBC with Differential/Platelet - VITAMIN D 25 Hydroxy (Vit-D Deficiency, Fractures)  9. Hepatitis C screen  - Hepatitis C antibody  10. Routine screening for STI (sexually transmitted infection)  - HIV Antibody (routine testing w rflx) - RPR - Cytology - PAP  11. Cervical cancer screening  - Cytology - PAP  12. Breast lump on left side at 11 o'clock position  - US BREAST LTD UNI RIGHT INC AXILLA; Future   -USPSTF grade A and B recommendations reviewed with patient; age-appropriate recommendations, preventive care, screening tests, etc discussed and encouraged; healthy living encouraged; see AVS for patient education given to patient -Discussed importance of 150 minutes  of physical activity weekly, eat two servings of fish weekly, eat one serving of tree nuts ( cashews, pistachios, pecans, almonds.Marland Kitchen.) every other day, eat 6 servings  of fruit/vegetables daily and drink plenty of water and avoid sweet beverages.

## 2020-08-23 ENCOUNTER — Other Ambulatory Visit: Payer: Self-pay

## 2020-08-23 ENCOUNTER — Telehealth (INDEPENDENT_AMBULATORY_CARE_PROVIDER_SITE_OTHER): Payer: BC Managed Care – PPO | Admitting: Family Medicine

## 2020-08-23 ENCOUNTER — Other Ambulatory Visit (HOSPITAL_COMMUNITY)
Admission: RE | Admit: 2020-08-23 | Discharge: 2020-08-23 | Disposition: A | Discharge status: Home / Self Care | Payer: Self-pay | Source: Ambulatory Visit | Attending: Family Medicine | Admitting: Family Medicine

## 2020-08-23 ENCOUNTER — Encounter: Payer: Self-pay | Admitting: Family Medicine

## 2020-08-23 VITALS — BP 90/62 | HR 74 | Temp 97.7°F | Resp 14 | Ht 62.0 in | Wt 119.5 lb

## 2020-08-23 DIAGNOSIS — Z1159 Encounter for screening for other viral diseases: Secondary | ICD-10-CM

## 2020-08-23 DIAGNOSIS — N6322 Unspecified lump in the left breast, upper inner quadrant: Secondary | ICD-10-CM

## 2020-08-23 DIAGNOSIS — Z3042 Encounter for surveillance of injectable contraceptive: Secondary | ICD-10-CM | POA: Diagnosis not present

## 2020-08-23 DIAGNOSIS — Z13220 Encounter for screening for lipoid disorders: Secondary | ICD-10-CM

## 2020-08-23 DIAGNOSIS — Z23 Encounter for immunization: Secondary | ICD-10-CM | POA: Diagnosis not present

## 2020-08-23 DIAGNOSIS — Z113 Encounter for screening for infections with a predominantly sexual mode of transmission: Secondary | ICD-10-CM | POA: Diagnosis not present

## 2020-08-23 DIAGNOSIS — Z124 Encounter for screening for malignant neoplasm of cervix: Secondary | ICD-10-CM

## 2020-08-23 DIAGNOSIS — B349 Viral infection, unspecified: Secondary | ICD-10-CM

## 2020-08-23 DIAGNOSIS — Z79899 Other long term (current) drug therapy: Secondary | ICD-10-CM | POA: Diagnosis not present

## 2020-08-23 DIAGNOSIS — Z131 Encounter for screening for diabetes mellitus: Secondary | ICD-10-CM

## 2020-08-23 DIAGNOSIS — Z0000 Encounter for general adult medical examination without abnormal findings: Secondary | ICD-10-CM | POA: Diagnosis not present

## 2020-08-23 LAB — POCT URINE PREGNANCY: Preg Test, Ur: NEGATIVE

## 2020-08-23 MED ORDER — MEDROXYPROGESTERONE ACETATE 150 MG/ML IM SUSP: 150.0000 mg | INTRAMUSCULAR | 0 refills | Status: DC

## 2020-08-23 MED ORDER — MEDROXYPROGESTERONE ACETATE 150 MG/ML IM SUSP: 150.0000 mg | INTRAMUSCULAR | 3 refills | Status: DC

## 2020-08-24 LAB — COMPLETE METABOLIC PANEL WITH GFR
AG Ratio: 1.8 (calc) (ref 1.0–2.5)
ALT: 29 U/L (ref 6–29)
AST: 24 U/L (ref 10–30)
Albumin: 4.7 g/dL (ref 3.6–5.1)
Alkaline phosphatase (APISO): 66 U/L (ref 31–125)
BUN: 17 mg/dL (ref 7–25)
CO2: 26 mmol/L (ref 20–32)
Calcium: 10 mg/dL (ref 8.6–10.2)
Chloride: 105 mmol/L (ref 98–110)
Creat: 0.68 mg/dL (ref 0.50–1.10)
GFR, Est African American: 144 mL/min/{1.73_m2} (ref >=60)
GFR, Est Non African American: 124 mL/min/{1.73_m2} (ref >=60)
Globulin: 2.6 g/dL (calc) (ref 1.9–3.7)
Glucose, Bld: 79 mg/dL (ref 65–99)
Potassium: 4.2 mmol/L (ref 3.5–5.3)
Sodium: 139 mmol/L (ref 135–146)
Total Bilirubin: 0.6 mg/dL (ref 0.2–1.2)
Total Protein: 7.3 g/dL (ref 6.1–8.1)

## 2020-08-24 LAB — RPR: RPR Ser Ql: NONREACTIVE

## 2020-08-24 LAB — HEMOGLOBIN A1C
Hgb A1c MFr Bld: 5.1 % of total Hgb (ref <5.7)
Mean Plasma Glucose: 100 mg/dL
eAG (mmol/L): 5.5 mmol/L

## 2020-08-24 LAB — CBC WITH DIFFERENTIAL/PLATELET
Absolute Monocytes: 428 cells/uL (ref 200–950)
Basophils Absolute: 32 cells/uL (ref 0–200)
Basophils Relative: 0.5 %
Eosinophils Absolute: 101 cells/uL (ref 15–500)
Eosinophils Relative: 1.6 %
HCT: 40.2 % (ref 35.0–45.0)
Hemoglobin: 13.9 g/dL (ref 11.7–15.5)
Lymphs Abs: 2583 cells/uL (ref 850–3900)
MCH: 30 pg (ref 27.0–33.0)
MCHC: 34.6 g/dL (ref 32.0–36.0)
MCV: 86.6 fL (ref 80.0–100.0)
MPV: 11.4 fL (ref 7.5–12.5)
Monocytes Relative: 6.8 %
Neutro Abs: 3156 cells/uL (ref 1500–7800)
Neutrophils Relative %: 50.1 %
Platelets: 290 10*3/uL (ref 140–400)
RBC: 4.64 10*6/uL (ref 3.80–5.10)
RDW: 11.2 % (ref 11.0–15.0)
Total Lymphocyte: 41 %
WBC: 6.3 10*3/uL (ref 3.8–10.8)

## 2020-08-24 LAB — LIPID PANEL
Cholesterol: 153 mg/dL (ref <200)
HDL: 54 mg/dL (ref >=50)
LDL Cholesterol (Calc): 85 mg/dL (calc)
Non-HDL Cholesterol (Calc): 99 mg/dL (calc) (ref <130)
Total CHOL/HDL Ratio: 2.8 (calc) (ref <5.0)
Triglycerides: 65 mg/dL (ref <150)

## 2020-08-24 LAB — HEPATITIS C ANTIBODY
Hepatitis C Ab: NONREACTIVE
SIGNAL TO CUT-OFF: 0.01 (ref <1.00)

## 2020-08-24 LAB — HIV ANTIBODY (ROUTINE TESTING W REFLEX): HIV 1&2 Ab, 4th Generation: NONREACTIVE

## 2020-08-24 LAB — VITAMIN D 25 HYDROXY (VIT D DEFICIENCY, FRACTURES): Vit D, 25-Hydroxy: 14 ng/mL — ABNORMAL LOW (ref 30–100)

## 2020-08-24 NOTE — Addendum Note (Signed)
Addended by: Ruel Favors on: 08/24/2020 08:45 AM   Modules accepted: Orders

## 2020-08-29 LAB — CYTOLOGY - PAP
Chlamydia: NEGATIVE
Comment: NEGATIVE
Comment: NORMAL
Diagnosis: NEGATIVE
Neisseria Gonorrhea: NEGATIVE

## 2020-08-30 ENCOUNTER — Other Ambulatory Visit: Payer: Self-pay

## 2020-08-30 ENCOUNTER — Ambulatory Visit (INDEPENDENT_AMBULATORY_CARE_PROVIDER_SITE_OTHER): Payer: BC Managed Care – PPO

## 2020-08-30 DIAGNOSIS — Z3042 Encounter for surveillance of injectable contraceptive: Secondary | ICD-10-CM

## 2020-08-30 MED ORDER — MEDROXYPROGESTERONE ACETATE 150 MG/ML IM SUSP
150.0000 mg | Freq: Once | INTRAMUSCULAR | Status: AC
  Administered 2020-08-30: 150 mg via INTRAMUSCULAR

## 2020-09-30 ENCOUNTER — Telehealth: Payer: Self-pay

## 2020-09-30 DIAGNOSIS — N6322 Unspecified lump in the left breast, upper inner quadrant: Secondary | ICD-10-CM

## 2020-09-30 NOTE — Telephone Encounter (Signed)
Breast us ordered

## 2020-10-06 ENCOUNTER — Other Ambulatory Visit: Payer: Self-pay

## 2020-10-06 ENCOUNTER — Ambulatory Visit
Admission: RE | Admit: 2020-10-06 | Discharge: 2020-10-06 | Disposition: A | Discharge status: Home / Self Care | Payer: BC Managed Care – PPO | Source: Ambulatory Visit | Attending: Family Medicine | Admitting: Family Medicine

## 2020-10-06 DIAGNOSIS — N6322 Unspecified lump in the left breast, upper inner quadrant: Secondary | ICD-10-CM | POA: Diagnosis not present

## 2020-10-06 DIAGNOSIS — N6489 Other specified disorders of breast: Secondary | ICD-10-CM | POA: Diagnosis not present

## 2020-11-25 ENCOUNTER — Other Ambulatory Visit: Payer: Self-pay

## 2020-11-25 ENCOUNTER — Ambulatory Visit (INDEPENDENT_AMBULATORY_CARE_PROVIDER_SITE_OTHER): Payer: BC Managed Care – PPO

## 2020-11-25 DIAGNOSIS — Z3042 Encounter for surveillance of injectable contraceptive: Secondary | ICD-10-CM

## 2020-11-25 MED ORDER — MEDROXYPROGESTERONE ACETATE 150 MG/ML IM SUSP
150.0000 mg | Freq: Once | INTRAMUSCULAR | Status: AC
  Administered 2020-11-25: 150 mg via INTRAMUSCULAR

## 2021-02-22 ENCOUNTER — Ambulatory Visit (INDEPENDENT_AMBULATORY_CARE_PROVIDER_SITE_OTHER): Payer: BC Managed Care – PPO

## 2021-02-22 ENCOUNTER — Other Ambulatory Visit: Payer: Self-pay

## 2021-02-22 DIAGNOSIS — Z3042 Encounter for surveillance of injectable contraceptive: Secondary | ICD-10-CM | POA: Diagnosis not present

## 2021-02-22 MED ORDER — MEDROXYPROGESTERONE ACETATE 150 MG/ML IM SUSP
150.0000 mg | Freq: Once | INTRAMUSCULAR | Status: AC
  Administered 2021-02-22: 150 mg via INTRAMUSCULAR
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
## 2021-05-23 ENCOUNTER — Ambulatory Visit (INDEPENDENT_AMBULATORY_CARE_PROVIDER_SITE_OTHER): Payer: BC Managed Care – PPO

## 2021-05-23 ENCOUNTER — Other Ambulatory Visit: Payer: Self-pay

## 2021-05-23 DIAGNOSIS — Z3042 Encounter for surveillance of injectable contraceptive: Secondary | ICD-10-CM

## 2021-05-23 MED ORDER — MEDROXYPROGESTERONE ACETATE 150 MG/ML IM SUSP
150.0000 mg | Freq: Once | INTRAMUSCULAR | Status: AC
  Administered 2021-05-23: 150 mg via INTRAMUSCULAR

## 2021-05-23 NOTE — Progress Notes (Signed)
Depo Injection 

## 2021-08-16 ENCOUNTER — Other Ambulatory Visit: Payer: Self-pay | Admitting: Family Medicine

## 2021-08-16 DIAGNOSIS — Z3042 Encounter for surveillance of injectable contraceptive: Secondary | ICD-10-CM

## 2021-08-16 NOTE — Telephone Encounter (Signed)
Requested medications are due for refill today.  yes  Requested medications are on the active medications list.  yes  Last refill. 08/01/2020  Future visit scheduled.   yes  Notes to clinic.  Medication not assigned to a protocol. Please review.    Requested Prescriptions  Pending Prescriptions Disp Refills   medroxyPROGESTERone (DEPO-PROVERA) 150 MG/ML injection [Pharmacy Med Name: MEDROXYPROGESTERONE 150 MG/ML] 1 mL 3    Sig: Inject 1 mL (150 mg total) into the muscle every 3 (three) months.     Off-Protocol Failed - 08/16/2021  1:20 AM      Failed - Medication not assigned to a protocol, review manually.      Passed - Valid encounter within last 12 months    Recent Outpatient Visits           11 months ago Well adult exam   [facility] Steele Sizer, MD   2 years ago Well woman exam   [facility] Steele Sizer, MD   5 years ago Hematuria, unspecified type   [facility] Steele Sizer, MD   5 years ago Pre-syncope   [facility] Steele Sizer, MD   6 years ago Chronic constipation   [facility] Steele Sizer, MD       Future Appointments             In 1 week Steele Sizer, MD [facility], [facility]

## 2021-08-21 ENCOUNTER — Ambulatory Visit (INDEPENDENT_AMBULATORY_CARE_PROVIDER_SITE_OTHER): Payer: BC Managed Care – PPO

## 2021-08-21 DIAGNOSIS — Z3042 Encounter for surveillance of injectable contraceptive: Secondary | ICD-10-CM

## 2021-08-21 MED ORDER — MEDROXYPROGESTERONE ACETATE 150 MG/ML IM SUSP
150.0000 mg | Freq: Once | INTRAMUSCULAR | Status: AC
  Administered 2021-08-21: 150 mg via INTRAMUSCULAR

## 2021-08-22 NOTE — Progress Notes (Signed)
Name: Sandra Hayes [facility]   MRN: [id number]    DOB: 1996-01-28   Date:08/23/2021       Progress Note  Subjective  Chief Complaint  Annual Exam  HPI  Patient presents for annual CPE.  She lost a lot of weight in the past two years. Asked if she is depressed, she thinks just less sedentary. However later in the visit she wonders if she is not as happy. She has a bachelor degree and did not get into grad school   Diet: she cooks every night and takes left over to work   Exercise: continue regular physical activity    Flowsheet Row Video Visit from 08/23/2020 in [facility]  AUDIT-C Score 0      Depression: Phq 9 is  negative Depression screen [facility] 2/9 08/23/2021 08/23/2020 01/23/2019 08/01/2016 11/29/2015  Decreased Interest 0 0 0 0 0  Down, Depressed, Hopeless 0 0 0 0 0  PHQ - 2 Score 0 0 0 0 0  Altered sleeping 0 - 0 - -  Tired, decreased energy 0 - 0 - -  Change in appetite 0 - 0 - -  Feeling bad or failure about yourself  0 - 0 - -  Trouble concentrating 0 - 0 - -  Moving slowly or fidgety/restless 0 - 0 - -  Suicidal thoughts 0 - 0 - -  PHQ-9 Score 0 - 0 - -  Difficult doing work/chores Not difficult at all - Not difficult at all - -   Hypertension: BP Readings from Last 3 Encounters:  08/23/20 90/62  01/30/20 139/78  01/23/19 118/68   Obesity: Wt Readings from Last 3 Encounters:  08/23/21 106 lb 3.2 oz (48.2 kg)  08/23/20 119 lb 8 oz (54.2 kg)  01/30/20 140 lb (63.5 kg)   BMI Readings from Last 3 Encounters:  08/23/21 19.42 kg/m  08/23/20 21.86 kg/m  01/30/20 25.61 kg/m     Vaccines:   HPV: up to date  Pneumonia: educated and discussed with patient. Flu: educated and discussed with patient.She refused it  COVID-19: not interested   Hep C Screening: 08/23/20 STD testing and prevention (HIV/chl/gon/syphilis): 08/23/20 Intimate partner violence: negative Sexual History : same partner, living together, no pain  Menstrual History/LMP/Abnormal  Bleeding: he is on Depo - no cycles, discussed high calcium diet and take vitamin D daily  Incontinence Symptoms: no problems  Breast cancer:  - Last Mammogram: N/A - BRCA gene screening: N/A  Osteoporosis prevention : Discussed high calcium and vitamin D supplementation, weight bearing exercises  Cervical cancer screening: 08/23/20  Skin cancer: Discussed monitoring for atypical lesions   ECG: 02/02/20  Advanced Care Planning: A voluntary discussion about advance care planning including the explanation and discussion of advance directives.  Discussed health care proxy and Living will, and the patient was able to identify a health care proxy as mother   Lipids: Lab Results  Component Value Date   CHOL 153 08/23/2020   CHOL 155 01/23/2019   CHOL 121 04/07/2015   Lab Results  Component Value Date   HDL 54 08/23/2020   HDL 65 01/23/2019   Lab Results  Component Value Date   LDLCALC 85 08/23/2020   LDLCALC 76 01/23/2019   Lab Results  Component Value Date   TRIG 65 08/23/2020   TRIG 64 01/23/2019   Lab Results  Component Value Date   CHOLHDL 2.8 08/23/2020   CHOLHDL 2.4 01/23/2019   No results found for: LDLDIRECT  Glucose: Glucose,  Bld  Date Value Ref Range Status  08/23/2020 79 65 - 99 mg/dL Final    Comment:    .            Fasting reference interval .   01/30/2020 94 70 - 99 mg/dL Final    Comment:    Glucose reference range applies only to samples taken after fasting for at least 8 hours.  01/23/2019 77 65 - 99 mg/dL Final    Comment:    .            Fasting reference interval .    Glucose-Capillary  Date Value Ref Range Status  01/30/2020 87 70 - 99 mg/dL Final    Comment:    Glucose reference range applies only to samples taken after fasting for at least 8 hours.    Patient Active Problem List   Diagnosis Date Noted   Syncope and collapse 12/15/2015   Chronic constipation 01/10/2015   Dermatitis, eczematoid 01/10/2015   Perennial allergic  rhinitis 01/10/2015   Vitamin D deficiency 01/10/2015   H/O urinary disorder 02/02/2008    Past Surgical History:  Procedure Laterality Date   cranial synostosis repair  2001   PEG TUBE PLACEMENT      Family History  Adopted: Yes    Social History   Socioeconomic History   Marital status: Soil scientist    Spouse name: Not on file   Number of children: 0   Years of education: 16   Highest education level: Some college, no degree  Occupational History   Occupation: Transport planner   Tobacco Use   Smoking status: Never   Smokeless tobacco: Never  Vaping Use   Vaping Use: Never used  Substance and Sexual Activity   Alcohol use: Yes    Alcohol/week: 3.0 standard drinks    Types: 3 Standard drinks or equivalent per week   Drug use: Yes    Frequency: 2.0 times per week    Types: Marijuana   Sexual activity: Yes    Partners: Male    Birth control/protection: Condom, Injection  Other Topics Concern   Not on file  Social History Narrative   [place] 2021    Unable to get into [employer] school -  wants to do Speech pathology at [employer] , however now thinking about being an Environmental consultant    Social Determinants of Radio broadcast assistant Strain: Low Risk    Difficulty of Paying Living Expenses: Not hard at all  Food Insecurity: No Food Insecurity   Worried About Charity fundraiser in the Last Year: Never true   Arboriculturist in the Last Year: Never true  Transportation Needs: No Transportation Needs   Lack of Transportation (Medical): No   Lack of Transportation (Non-Medical): No  Physical Activity: Sufficiently Active   Days of Exercise per Week: 5 days   Minutes of Exercise per Session: 60 min  Stress: No Stress Concern Present   Feeling of Stress : Not at all  Social Connections: Socially Isolated   Frequency of Communication with Friends and Family: More than three times a week   Frequency of Social Gatherings with Friends and  Family: Twice a week   Attends Religious Services: Never   Marine scientist or Organizations: No   Attends Archivist Meetings: Never   Marital Status: Never married  Human resources officer Violence: Not At Risk   Fear of Current or Ex-Partner: No  Emotionally Abused: No   Physically Abused: No   Sexually Abused: No     Current Outpatient Medications:    medroxyPROGESTERone (DEPO-PROVERA) 150 MG/ML injection, INJECT 1 ML (150 MG TOTAL) INTO THE MUSCLE EVERY 3 (THREE) MONTHS, Disp: 1 mL, Rfl: 3  No Known Allergies   ROS  Constitutional: Negative for fever , positive for weight change - discussed TSH but she would like to hold off for now .  Respiratory: Negative for cough and shortness of breath.   Cardiovascular: Negative for chest pain or palpitations.  Gastrointestinal: Negative for abdominal pain, no bowel changes.  Musculoskeletal: Negative for gait problem or joint swelling.  Skin: Negative for rash.  Neurological: Negative for dizziness or headache.  No other specific complaints in a complete review of systems (except as listed in HPI above).   Objective  Vitals:   08/23/21 1451  Pulse: 76  Resp: 16  Temp: 98 F (36.7 C)  SpO2: 98%  Weight: 106 lb 3.2 oz (48.2 kg)  Height: _0  (1.575 m)    Body mass index is 19.42 kg/m.  Physical Exam  Constitutional: Patient appears well-developed and well-nourished. No distress.  HENT: Head: Normocephalic and atraumatic. Ears: B TMs ok, no erythema or effusion; Nose: Not done Mouth/Throat: not done Eyes: Conjunctivae and EOM are normal. Pupils are equal, round, and reactive to light. No scleral icterus.  Neck: Normal range of motion. Neck supple. No JVD present. No thyromegaly present.  Cardiovascular: Normal rate, regular rhythm and normal heart sounds.  No murmur heard. No BLE edema. Pulmonary/Chest: Effort normal and breath sounds normal. No respiratory distress. Abdominal: Soft. Bowel sounds are normal,  no distension. There is no tenderness. no masses Breast: no lumps or masses, no nipple discharge or rashes FEMALE GENITALIA:  Not done  RECTAL: not done  Musculoskeletal: Normal range of motion, no joint effusions. No gross deformities Neurological: he is alert and oriented to person, place, and time. No cranial nerve deficit. Coordination, balance, strength, speech and gait are normal.  Skin: Skin is warm and dry. No rash noted. No erythema.  Psychiatric: Patient has a normal mood and affect. behavior is normal. Judgment and thought content normal.   Fall Risk: Fall Risk  08/23/2021 08/23/2020 01/23/2019 08/01/2016 11/29/2015  Falls in the past year? 0 0 0 No No  Number falls in past yr: 0 0 0 - -  Injury with Fall? 0 0 0 - -     Functional Status Survey: Is the patient deaf or have difficulty hearing?: No Does the patient have difficulty seeing, even when wearing glasses/contacts?: No Does the patient have difficulty concentrating, remembering, or making decisions?: No Does the patient have difficulty walking or climbing stairs?: No Does the patient have difficulty dressing or bathing?: No Does the patient have difficulty doing errands alone such as visiting a doctor's office or shopping?: No   Assessment & Plan  1. Well adult exam  - Cervicovaginal ancillary only  2. Screening examination for STD (sexually transmitted disease)  - Cervicovaginal ancillary only  3. Needs flu shot  Refused   4. Weight loss  She will return in 3 months to discuss it further, may be secondary to depression  -USPSTF grade A and B recommendations reviewed with patient; age-appropriate recommendations, preventive care, screening tests, etc discussed and encouraged; healthy living encouraged; see AVS for patient education given to patient -Discussed importance of 150 minutes of physical activity weekly, eat two servings of fish weekly, eat one serving of  tree nuts ( cashews, pistachios, pecans,  almonds.Marland Kitchen) every other day, eat 6 servings of fruit/vegetables daily and drink plenty of water and avoid sweet beverages.

## 2021-08-23 ENCOUNTER — Other Ambulatory Visit: Payer: Self-pay

## 2021-08-23 ENCOUNTER — Ambulatory Visit (INDEPENDENT_AMBULATORY_CARE_PROVIDER_SITE_OTHER): Payer: BC Managed Care – PPO | Admitting: Family Medicine

## 2021-08-23 ENCOUNTER — Encounter: Payer: Self-pay | Admitting: Family Medicine

## 2021-08-23 ENCOUNTER — Other Ambulatory Visit (HOSPITAL_COMMUNITY)
Admission: RE | Admit: 2021-08-23 | Discharge: 2021-08-23 | Disposition: A | Discharge status: Home / Self Care | Payer: BC Managed Care – PPO | Source: Ambulatory Visit | Attending: Family Medicine | Admitting: Family Medicine

## 2021-08-23 VITALS — BP 104/60 | HR 76 | Temp 98.0°F | Resp 16 | Ht 62.0 in | Wt 106.2 lb

## 2021-08-23 DIAGNOSIS — Z113 Encounter for screening for infections with a predominantly sexual mode of transmission: Secondary | ICD-10-CM | POA: Diagnosis not present

## 2021-08-23 DIAGNOSIS — Z23 Encounter for immunization: Secondary | ICD-10-CM | POA: Diagnosis not present

## 2021-08-23 DIAGNOSIS — Z0000 Encounter for general adult medical examination without abnormal findings: Secondary | ICD-10-CM | POA: Diagnosis not present

## 2021-08-23 DIAGNOSIS — R634 Abnormal weight loss: Secondary | ICD-10-CM | POA: Diagnosis not present

## 2021-08-23 NOTE — Patient Instructions (Signed)
Preventive Care 21-24 Years Old, Female °Preventive care refers to lifestyle choices and visits with your health care provider that can promote health and wellness. Preventive care visits are also called wellness exams. °What can I expect for my preventive care visit? °Counseling °During your preventive care visit, your health care provider may ask about your: °Medical history, including: °Past medical problems. °Family medical history. °Pregnancy history. °Current health, including: °Menstrual cycle. °Method of birth control. °Emotional well-being. °Home life and relationship well-being. °Sexual activity and sexual health. °Lifestyle, including: °Alcohol, nicotine or tobacco, and drug use. °Access to firearms. °Diet, exercise, and sleep habits. °Work and work environment. °Sunscreen use. °Safety issues such as seatbelt and bike helmet use. °Physical exam °Your health care provider may check your: °Height and weight. These may be used to calculate your BMI (body mass index). BMI is a measurement that tells if you are at a healthy weight. °Waist circumference. This measures the distance around your waistline. This measurement also tells if you are at a healthy weight and may help predict your risk of certain diseases, such as type 2 diabetes and high blood pressure. °Heart rate and blood pressure. °Body temperature. °Skin for abnormal spots. °What immunizations do I need? °Vaccines are usually given at various ages, according to a schedule. Your health care provider will recommend vaccines for you based on your age, medical history, and lifestyle or other factors, such as travel or where you work. °What tests do I need? °Screening °Your health care provider may recommend screening tests for certain conditions. This may include: °Pelvic exam and Pap test. °Lipid and cholesterol levels. °Diabetes screening. This is done by checking your blood sugar (glucose) after you have not eaten for a while (fasting). °Hepatitis B  test. °Hepatitis C test. °HIV (human immunodeficiency virus) test. °STI (sexually transmitted infection) testing, if you are at risk. °BRCA-related cancer screening. This may be done if you have a family history of breast, ovarian, tubal, or peritoneal cancers. °Talk with your health care provider about your test results, treatment options, and if necessary, the need for more tests. °Follow these instructions at home: °Eating and drinking ° °Eat a healthy diet that includes fresh fruits and vegetables, whole grains, lean protein, and low-fat dairy products. °Take vitamin and mineral supplements as recommended by your health care provider. °Do not drink alcohol if: °Your health care provider tells you not to drink. °You are pregnant, may be pregnant, or are planning to become pregnant. °If you drink alcohol: °Limit how much you have to 0-1 drink a day. °Know how much alcohol is in your drink. In the [place] one drink equals one 12 oz bottle of beer (355 mL), one 5 oz glass of wine (148 mL), or one 1½ oz glass of hard liquor (44 mL). °Lifestyle °Brush your teeth every morning and night with fluoride toothpaste. Floss one time each day. °Exercise for at least 30 minutes 5 or more days each week. °Do not use any products that contain nicotine or tobacco. These products include cigarettes, chewing tobacco, and vaping devices, such as e-cigarettes. If you need help quitting, ask your health care provider. °Do not use drugs. °If you are sexually active, practice safe sex. Use a condom or other form of protection to prevent STIs. °If you do not wish to become pregnant, use a form of birth control. If you plan to become pregnant, see your health care provider for a prepregnancy visit. °Find healthy ways to manage stress, such as: °Meditation, yoga,   or listening to music. °Journaling. °Talking to a trusted person. °Spending time with friends and family. °Minimize exposure to UV radiation to reduce your risk of skin  cancer. °Safety °Always wear your seat belt while driving or riding in a vehicle. °Do not drive: °If you have been drinking alcohol. Do not ride with someone who has been drinking. °If you have been using any mind-altering substances or drugs. °While texting. °When you are tired or distracted. °Wear a helmet and other protective equipment during sports activities. °If you have firearms in your house, make sure you follow all gun safety procedures. °Seek help if you have been physically or sexually abused. °What's next? °Go to your health care provider once a year for an annual wellness visit. °Ask your health care provider how often you should have your eyes and teeth checked. °Stay up to date on all vaccines. °This information is not intended to replace advice given to you by your health care provider. Make sure you discuss any questions you have with your health care provider. °Document Revised: 01/11/2021 Document Reviewed: 01/11/2021 °Elsevier Patient Education © 2022 Elsevier Inc. ° °

## 2021-08-24 DIAGNOSIS — Z113 Encounter for screening for infections with a predominantly sexual mode of transmission: Secondary | ICD-10-CM | POA: Diagnosis not present

## 2021-08-24 DIAGNOSIS — Z0000 Encounter for general adult medical examination without abnormal findings: Secondary | ICD-10-CM | POA: Diagnosis not present

## 2021-08-25 LAB — CERVICOVAGINAL ANCILLARY ONLY
Chlamydia: NEGATIVE
Comment: NEGATIVE
Comment: NEGATIVE
Comment: NORMAL
Neisseria Gonorrhea: NEGATIVE
Trichomonas: NEGATIVE

## 2021-11-13 IMAGING — US US BREAST*L* LIMITED INC AXILLA
1 series · 2 of 2 positions shown · non-contrast
Comparison: None.

CLINICAL DATA: Patient's referring clinician reports a lump the
left breast at 11 o'clock. The patient believes that this may have
been the right breast and not the left.

EXAM:
ULTRASOUND OF THE LEFT BREAST

[Series 1: us breast*left* limited inc axilla · 0.07mm/px · 2 of 2 slices shown]
[im 1/2]
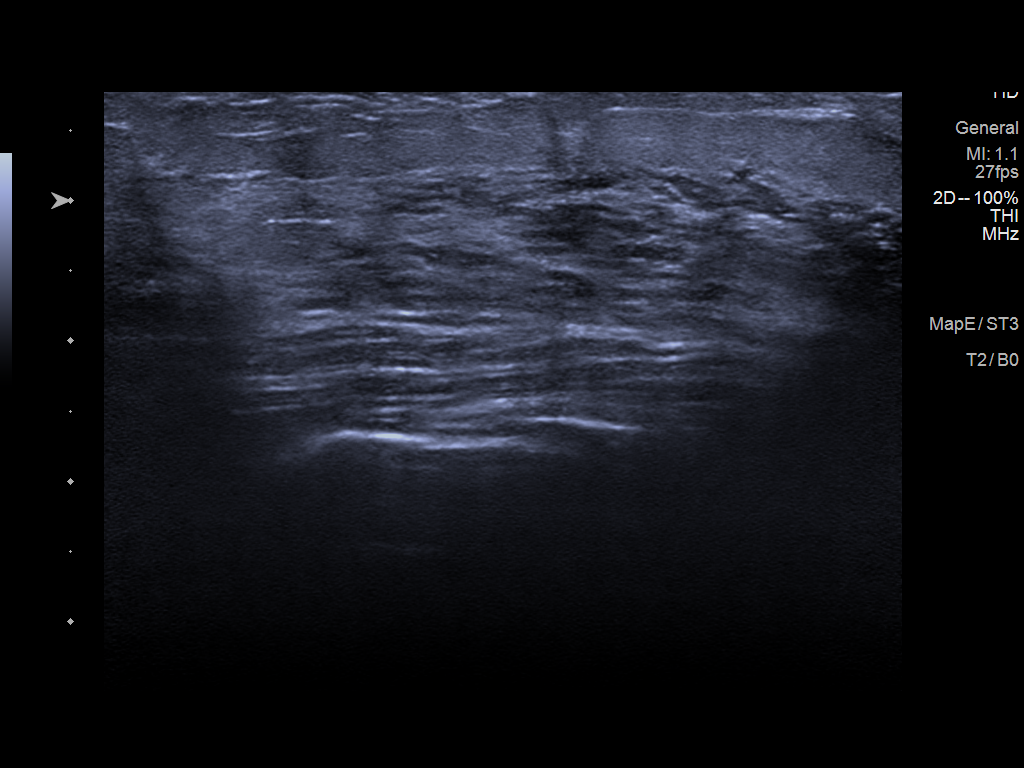
[im 2/2]
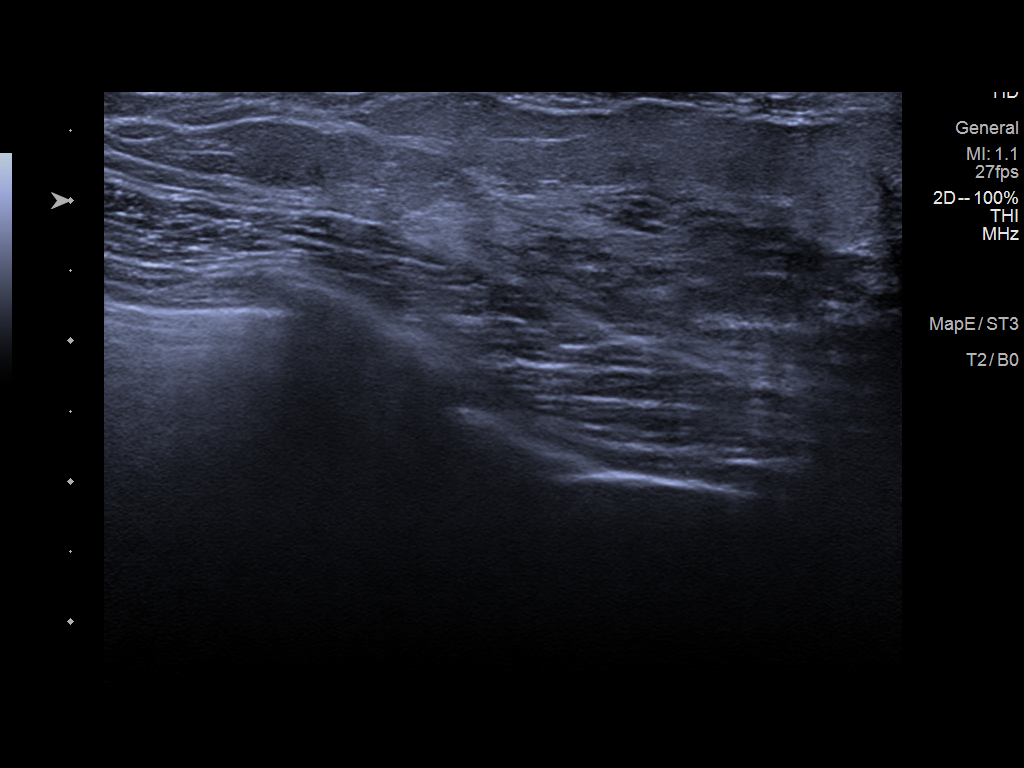

[2 of 2 positions shown; findings below may reference images not displayed]

FINDINGS: On physical exam, there is a lumpy texture to the fibroglandular
tissue throughout the upper outer quadrants of both breasts.

Targeted ultrasound is performed, showing heterogeneous
fibroglandular tissue throughout the upper outer left breast. No
mass, cyst or suspicious lesion.

Sonography was also performed of the upper outer right breast as a
comparison and since there was a possibility of a laterality error
on the order. Right breast also shows heterogeneous fibroglandular
tissue with no mass, cyst or suspicious lesion.
IMPRESSION: Negative exam.  No evidence of breast malignancy.

RECOMMENDATION:
Screening mammogram at age 40 unless there are persistent or
intervening clinical concerns. (Code:KD-I-ZDU)

I have discussed the findings and recommendations with the patient.
If applicable, a reminder letter will be sent to the patient
regarding the next appointment.

BI-RADS CATEGORY  1: Negative.

## 2021-11-17 ENCOUNTER — Ambulatory Visit (INDEPENDENT_AMBULATORY_CARE_PROVIDER_SITE_OTHER): Payer: BC Managed Care – PPO

## 2021-11-17 DIAGNOSIS — Z3042 Encounter for surveillance of injectable contraceptive: Secondary | ICD-10-CM | POA: Diagnosis not present

## 2021-11-17 MED ORDER — MEDROXYPROGESTERONE ACETATE 150 MG/ML IM SUSP
150.0000 mg | Freq: Once | INTRAMUSCULAR | Status: AC
  Administered 2021-11-17: 150 mg via INTRAMUSCULAR
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
## 2021-11-22 ENCOUNTER — Ambulatory Visit: Payer: BC Managed Care – PPO | Admitting: Family Medicine

## 2021-11-22 ENCOUNTER — Encounter: Payer: Self-pay | Admitting: Family Medicine

## 2021-11-22 VITALS — BP 100/78 | HR 75 | Temp 97.7°F | Resp 12 | Ht 62.0 in | Wt 110.3 lb

## 2021-11-22 DIAGNOSIS — M542 Cervicalgia: Secondary | ICD-10-CM | POA: Diagnosis not present

## 2021-11-22 DIAGNOSIS — E559 Vitamin D deficiency, unspecified: Secondary | ICD-10-CM | POA: Diagnosis not present

## 2021-11-22 DIAGNOSIS — F4010 Social phobia, unspecified: Secondary | ICD-10-CM | POA: Diagnosis not present

## 2021-11-22 DIAGNOSIS — R634 Abnormal weight loss: Secondary | ICD-10-CM

## 2021-11-22 DIAGNOSIS — M79601 Pain in right arm: Secondary | ICD-10-CM

## 2021-11-22 DIAGNOSIS — M79602 Pain in left arm: Secondary | ICD-10-CM

## 2021-11-22 MED ORDER — BACLOFEN 10 MG PO TABS: 10.0000 mg | ORAL_TABLET | Freq: Every evening | ORAL | 1 refills | Status: DC | PRN

## 2021-12-19 ENCOUNTER — Other Ambulatory Visit: Payer: Self-pay | Admitting: Family Medicine

## 2021-12-19 DIAGNOSIS — M79602 Pain in left arm: Secondary | ICD-10-CM

## 2021-12-19 DIAGNOSIS — M542 Cervicalgia: Secondary | ICD-10-CM

## 2022-01-02 ENCOUNTER — Other Ambulatory Visit: Payer: Self-pay | Admitting: Family Medicine

## 2022-01-02 DIAGNOSIS — M542 Cervicalgia: Secondary | ICD-10-CM

## 2022-01-02 DIAGNOSIS — M79602 Pain in left arm: Secondary | ICD-10-CM

## 2022-02-15 ENCOUNTER — Ambulatory Visit (INDEPENDENT_AMBULATORY_CARE_PROVIDER_SITE_OTHER): Payer: BC Managed Care – PPO

## 2022-02-15 DIAGNOSIS — Z3042 Encounter for surveillance of injectable contraceptive: Secondary | ICD-10-CM | POA: Diagnosis not present

## 2022-02-15 MED ORDER — MEDROXYPROGESTERONE ACETATE 150 MG/ML IM SUSP
150.0000 mg | Freq: Once | INTRAMUSCULAR | Status: AC
  Administered 2022-02-15: 150 mg via INTRAMUSCULAR

## 2022-02-15 MED ORDER — MEDROXYPROGESTERONE ACETATE 150 MG/ML IM SUSP: 150.0000 mg | Freq: Once | INTRAMUSCULAR | Status: DC

## 2022-02-15 MED ORDER — MEDROXYPROGESTERONE ACETATE 104 MG/0.65ML ~~LOC~~ SUSY: 104.0000 mg | PREFILLED_SYRINGE | Freq: Once | SUBCUTANEOUS | Status: DC

## 2022-05-15 ENCOUNTER — Ambulatory Visit (INDEPENDENT_AMBULATORY_CARE_PROVIDER_SITE_OTHER): Payer: BC Managed Care – PPO

## 2022-05-15 DIAGNOSIS — Z3042 Encounter for surveillance of injectable contraceptive: Secondary | ICD-10-CM | POA: Diagnosis not present

## 2022-05-15 MED ORDER — MEDROXYPROGESTERONE ACETATE 150 MG/ML IM SUSP
150.0000 mg | Freq: Once | INTRAMUSCULAR | Status: AC
  Administered 2022-05-15: 150 mg via INTRAMUSCULAR

## 2022-08-09 ENCOUNTER — Other Ambulatory Visit: Payer: Self-pay | Admitting: Family Medicine

## 2022-08-09 DIAGNOSIS — Z3042 Encounter for surveillance of injectable contraceptive: Secondary | ICD-10-CM

## 2022-08-13 ENCOUNTER — Ambulatory Visit (INDEPENDENT_AMBULATORY_CARE_PROVIDER_SITE_OTHER): Payer: BC Managed Care – PPO

## 2022-08-13 DIAGNOSIS — Z3042 Encounter for surveillance of injectable contraceptive: Secondary | ICD-10-CM

## 2022-08-13 MED ORDER — MEDROXYPROGESTERONE ACETATE 150 MG/ML IM SUSP
150.0000 mg | Freq: Once | INTRAMUSCULAR | Status: AC
  Administered 2022-08-13: 150 mg via INTRAMUSCULAR

## 2022-08-23 NOTE — Progress Notes (Signed)
Name: Tecia Cinnamon [facility]   MRN: [id number]    DOB: 24-May-1996   Date:08/24/2022       Progress Note  Subjective  Chief Complaint  Annual Exam  HPI  Patient presents for annual CPE.  Diet: she cooks at home, packs her lunch  Exercise: she has a physical job   Last Eye Exam: not up to date  Last Dental Exam: scheduled   [place] Visit from 08/24/2022 in [facility]  AUDIT-C Score 1      Depression: Phq 9 is  negative    08/24/2022    3:12 PM 11/22/2021    3:53 PM 08/23/2021    2:52 PM 08/23/2020    3:53 PM 01/23/2019    3:35 PM  Depression screen PHQ 2/9  Decreased Interest 0 0 0 0 0  Down, Depressed, Hopeless 0 0 0 0 0  PHQ - 2 Score 0 0 0 0 0  Altered sleeping 0  0  0  Tired, decreased energy 1  0  0  Change in appetite 0  0  0  Feeling bad or failure about yourself  0  0  0  Trouble concentrating 0  0  0  Moving slowly or fidgety/restless 0  0  0  Suicidal thoughts 0  0  0  PHQ-9 Score 1  0  0  Difficult doing work/chores Not difficult at all  Not difficult at all  Not difficult at all   Hypertension: BP Readings from Last 3 Encounters:  08/24/22 104/72  11/22/21 100/78  08/23/21 104/60   Obesity: Wt Readings from Last 3 Encounters:  08/24/22 116 lb 4.8 oz (52.8 kg)  11/22/21 110 lb 4.8 oz (50 kg)  08/23/21 106 lb 3.2 oz (48.2 kg)   BMI Readings from Last 3 Encounters:  08/24/22 21.27 kg/m  11/22/21 20.17 kg/m  08/23/21 19.42 kg/m     Vaccines:   HPV: up to date Tdap: up to date Shingrix: N/A Pneumonia: N/A Flu: 2022, due today  COVID-19: refused    Hep C Screening: 08/23/20 STD testing and prevention (HIV/chl/gon/syphilis): 08/23/20 Intimate partner violence: negative screen  Sexual History : still has one partner, not living together  Menstrual History/LMP/Abnormal Bleeding: cycles not present due to Depo  Discussed importance of follow up if any post-menopausal bleeding: not applicable  Incontinence  Symptoms: negative for symptoms   Breast cancer:  - Last Mammogram: N/A - BRCA gene screening: N/A   Osteoporosis Prevention : Discussed high calcium and vitamin D supplementation, weight bearing exercises Bone density: N/A   Cervical cancer screening: 08/23/20  Skin cancer: Discussed monitoring for atypical lesions  Colorectal cancer: N/A   Lung cancer:  Low Dose CT Chest recommended if Age 11-80 years, 20 pack-year currently smoking OR have quit w/in 15years. Patient does not qualify for screen   ECG: 02/02/20  Advanced Care Planning: A voluntary discussion about advance care planning including the explanation and discussion of advance directives.  Discussed health care proxy and Living will, and the patient was able to identify a health care proxy as mother .  Patient does not have a living will and power of attorney of health care   Lipids: Lab Results  Component Value Date   CHOL 153 08/23/2020   CHOL 155 01/23/2019   CHOL 121 04/07/2015   Lab Results  Component Value Date   HDL 54 08/23/2020   HDL 65 01/23/2019   Lab Results  Component Value Date  LDLCALC 85 08/23/2020   LDLCALC 76 01/23/2019   Lab Results  Component Value Date   TRIG 65 08/23/2020   TRIG 64 01/23/2019   Lab Results  Component Value Date   CHOLHDL 2.8 08/23/2020   CHOLHDL 2.4 01/23/2019   No results found for: "LDLDIRECT"  Glucose: Glucose, Bld  Date Value Ref Range Status  08/23/2020 79 65 - 99 mg/dL Final    Comment:    .            Fasting reference interval .   01/30/2020 94 70 - 99 mg/dL Final    Comment:    Glucose reference range applies only to samples taken after fasting for at least 8 hours.  01/23/2019 77 65 - 99 mg/dL Final    Comment:    .            Fasting reference interval .    Glucose-Capillary  Date Value Ref Range Status  01/30/2020 87 70 - 99 mg/dL Final    Comment:    Glucose reference range applies only to samples taken after fasting for at least 8  hours.    Patient Active Problem List   Diagnosis Date Noted   Syncope and collapse 12/15/2015   Chronic constipation 01/10/2015   Dermatitis, eczematoid 01/10/2015   Perennial allergic rhinitis 01/10/2015   Vitamin D deficiency 01/10/2015   H/O urinary disorder 02/02/2008    Past Surgical History:  Procedure Laterality Date   cranial synostosis repair  2001   PEG TUBE PLACEMENT      Family History  Adopted: Yes    Social History   Socioeconomic History   Marital status: Media planner    Spouse name: Not on file   Number of children: 0   Years of education: 16   Highest education level: Some college, no degree  Occupational History   Occupation: Doctor, hospital   Tobacco Use   Smoking status: Never   Smokeless tobacco: Never  Vaping Use   Vaping Use: Never used  Substance and Sexual Activity   Alcohol use: Yes    Alcohol/week: 3.0 standard drinks of alcohol    Types: 3 Standard drinks or equivalent per week   Drug use: Yes    Frequency: 2.0 times per week    Types: Marijuana   Sexual activity: Yes    Partners: Male    Birth control/protection: Condom, Injection  Other Topics Concern   Not on file  Social History Narrative   Graduated [employer] 2021    Unable to get into [employer] school -  wants to do Speech pathology at [employer] , however now thinking about being an Geophysicist/field seismologist    She is helping her mother raise her little sister, she keeps her in the evenings, boyfriend moved out   Social Determinants of Health   Financial Resource Strain: Low Risk  (08/24/2022)   Overall Financial Resource Strain (CARDIA)    Difficulty of Paying Living Expenses: Not hard at all  Food Insecurity: No Food Insecurity (08/24/2022)   Hunger Vital Sign    Worried About Running Out of Food in the Last Year: Never true    Ran Out of Food in the Last Year: Never true  Transportation Needs: No Transportation Needs (08/24/2022)   PRAPARE - Doctor, general practice (Medical): No    Lack of Transportation (Non-Medical): No  Physical Activity: Sufficiently Active (08/24/2022)   Exercise Vital Sign    Days of  Exercise per Week: 5 days    Minutes of Exercise per Session: 30 min  Stress: No Stress Concern Present (08/24/2022)   [employer] of Occupational Health - Occupational Stress Questionnaire    Feeling of Stress : Not at all  Social Connections: Socially Isolated (08/24/2022)   Social Connection and Isolation Panel [NHANES]    Frequency of Communication with Friends and Family: Three times a week    Frequency of Social Gatherings with Friends and Family: Once a week    Attends Religious Services: Never    Database administrator or Organizations: No    Attends Banker Meetings: Never    Marital Status: Never married  Intimate Partner Violence: Not At Risk (08/24/2022)   Humiliation, Afraid, Rape, and Kick questionnaire    Fear of Current or Ex-Partner: No    Emotionally Abused: No    Physically Abused: No    Sexually Abused: No     Current Outpatient Medications:    medroxyPROGESTERone (DEPO-PROVERA) 150 MG/ML injection, INJECT 1 ML (150 MG TOTAL) INTO THE MUSCLE EVERY 3 (THREE) MONTHS, Disp: 1 mL, Rfl: 0   baclofen (LIORESAL) 10 MG tablet, TAKE 1 TABLET BY MOUTH AT BEDTIME AS NEEDED FOR MUSCLE SPASMS (Patient not taking: Reported on 08/24/2022), Disp: 30 tablet, Rfl: 1  No Known Allergies   ROS  Constitutional: Negative for fever or weight change.  Respiratory: Negative for cough and shortness of breath.   Cardiovascular: Negative for chest pain or palpitations.  Gastrointestinal: Negative for abdominal pain, no bowel changes.  Musculoskeletal: Negative for gait problem or joint swelling.  Skin: Negative for rash.  Neurological: Negative for dizziness or headache.  No other specific complaints in a complete review of systems (except as listed in HPI above).   Objective  Vitals:   08/24/22 1510   BP: 104/72  Pulse: 69  Resp: 12  Temp: 98.2 F (36.8 C)  TempSrc: Oral  SpO2: 100%  Weight: 116 lb 4.8 oz (52.8 kg)  Height: 5\' 2"  (1.575 m)    Body mass index is 21.27 kg/m.  Physical Exam  Constitutional: Patient appears well-developed and well-nourished. No distress.  HENT: Head: Normocephalic and atraumatic. Ears: B TMs ok, no erythema or effusion; Nose: Nose normal. Mouth/Throat: Oropharynx is clear and moist. No oropharyngeal exudate.  Eyes: Conjunctivae and EOM are normal. Pupils are equal, round, and reactive to light. No scleral icterus.  Neck: Normal range of motion. Neck supple. No JVD present. No thyromegaly present.  Cardiovascular: Normal rate, regular rhythm and normal heart sounds.  No murmur heard. No BLE edema. Pulmonary/Chest: Effort normal and breath sounds normal. No respiratory distress. Abdominal: Soft. Bowel sounds are normal, no distension. There is no tenderness. no masses Breast: no lumps or masses, no nipple discharge or rashes FEMALE GENITALIA:  Not done  RECTAL: not done  Musculoskeletal: Normal range of motion, no joint effusions. No gross deformities Neurological: he is alert and oriented to person, place, and time. No cranial nerve deficit. Coordination, balance, strength, speech and gait are normal.  Skin: Skin is warm and dry. No rash noted. No erythema. Facial acne  Psychiatric: Patient has a normal mood and affect. behavior is normal. Judgment and thought content normal.    Fall Risk:    08/24/2022    3:11 PM 11/22/2021    3:53 PM 08/23/2021    2:52 PM 08/23/2020    3:53 PM 01/23/2019    3:34 PM  Fall Risk   Falls in the  past year? 0 0 0 0 0  Number falls in past yr:   0 0 0  Injury with Fall?   0 0 0  Risk for fall due to : No Fall Risks No Fall Risks     Follow up Falls prevention discussed;Education provided;Falls evaluation completed Falls prevention discussed        Functional Status Survey: Is the patient deaf or have  difficulty hearing?: No Does the patient have difficulty seeing, even when wearing glasses/contacts?: No Does the patient have difficulty concentrating, remembering, or making decisions?: No Does the patient have difficulty walking or climbing stairs?: No Does the patient have difficulty dressing or bathing?: No Does the patient have difficulty doing errands alone such as visiting a doctor's office or shopping?: No   Assessment & Plan  1. Well adult exam   2. Screen for STD (sexually transmitted disease)  refused  3. Needs flu shot  - Flu Vaccine QUAD 6+ mos PF IM (Fluarix Quad PF)    -USPSTF grade A and B recommendations reviewed with patient; age-appropriate recommendations, preventive care, screening tests, etc discussed and encouraged; healthy living encouraged; see AVS for patient education given to patient -Discussed importance of 150 minutes of physical activity weekly, eat two servings of fish weekly, eat one serving of tree nuts ( cashews, pistachios, pecans, almonds.Marland Kitchen) every other day, eat 6 servings of fruit/vegetables daily and drink plenty of water and avoid sweet beverages.   -Reviewed Health Maintenance: Yes.

## 2022-08-23 NOTE — Patient Instructions (Signed)

## 2022-08-24 ENCOUNTER — Encounter: Payer: Self-pay | Admitting: Family Medicine

## 2022-08-24 ENCOUNTER — Other Ambulatory Visit (HOSPITAL_COMMUNITY)
Admission: RE | Admit: 2022-08-24 | Discharge: 2022-08-24 | Disposition: A | Discharge status: Home / Self Care | Payer: BC Managed Care – PPO | Source: Ambulatory Visit | Attending: Family Medicine | Admitting: Family Medicine

## 2022-08-24 ENCOUNTER — Ambulatory Visit (INDEPENDENT_AMBULATORY_CARE_PROVIDER_SITE_OTHER): Payer: BC Managed Care – PPO | Admitting: Family Medicine

## 2022-08-24 ENCOUNTER — Other Ambulatory Visit: Payer: Self-pay | Admitting: Family Medicine

## 2022-08-24 VITALS — BP 104/72 | HR 69 | Temp 98.2°F | Resp 12 | Ht 62.0 in | Wt 116.3 lb

## 2022-08-24 DIAGNOSIS — Z0000 Encounter for general adult medical examination without abnormal findings: Secondary | ICD-10-CM | POA: Diagnosis not present

## 2022-08-24 DIAGNOSIS — Z23 Encounter for immunization: Secondary | ICD-10-CM

## 2022-08-24 DIAGNOSIS — Z113 Encounter for screening for infections with a predominantly sexual mode of transmission: Secondary | ICD-10-CM | POA: Diagnosis not present

## 2022-08-28 LAB — CERVICOVAGINAL ANCILLARY ONLY
Chlamydia: NEGATIVE
Comment: NEGATIVE
Comment: NORMAL
Neisseria Gonorrhea: NEGATIVE

## 2022-09-11 ENCOUNTER — Ambulatory Visit: Payer: Self-pay

## 2022-09-11 DIAGNOSIS — R300 Dysuria: Secondary | ICD-10-CM | POA: Diagnosis not present

## 2022-09-11 DIAGNOSIS — N390 Urinary tract infection, site not specified: Secondary | ICD-10-CM | POA: Diagnosis not present

## 2022-10-30 ENCOUNTER — Other Ambulatory Visit: Payer: Self-pay | Admitting: Family Medicine

## 2022-10-30 DIAGNOSIS — Z3042 Encounter for surveillance of injectable contraceptive: Secondary | ICD-10-CM

## 2022-11-01 ENCOUNTER — Ambulatory Visit: Payer: BC Managed Care – PPO

## 2022-11-01 DIAGNOSIS — Z3042 Encounter for surveillance of injectable contraceptive: Secondary | ICD-10-CM

## 2022-11-01 MED ORDER — MEDROXYPROGESTERONE ACETATE 150 MG/ML IM SUSY
150.0000 mg | PREFILLED_SYRINGE | INTRAMUSCULAR | Status: DC
Start: 2022-11-01 — End: 2022-11-01

## 2022-11-02 ENCOUNTER — Ambulatory Visit (INDEPENDENT_AMBULATORY_CARE_PROVIDER_SITE_OTHER): Payer: BC Managed Care – PPO

## 2022-11-02 DIAGNOSIS — Z3042 Encounter for surveillance of injectable contraceptive: Secondary | ICD-10-CM

## 2022-11-02 MED ORDER — MEDROXYPROGESTERONE ACETATE 150 MG/ML IM SUSP
150.0000 mg | Freq: Once | INTRAMUSCULAR | Status: AC
Start: 2022-11-02 — End: 2022-11-02
  Administered 2022-11-02: 150 mg via INTRAMUSCULAR

## 2022-11-16 ENCOUNTER — Other Ambulatory Visit: Payer: Self-pay

## 2022-11-16 ENCOUNTER — Emergency Department: Payer: BC Managed Care – PPO

## 2022-11-16 ENCOUNTER — Emergency Department
Admission: EM | Admit: 2022-11-16 | Discharge: 2022-11-16 | Disposition: A | Discharge status: Home / Self Care | Payer: BC Managed Care – PPO | Attending: Emergency Medicine | Admitting: Emergency Medicine

## 2022-11-16 DIAGNOSIS — K2100 Gastro-esophageal reflux disease with esophagitis, without bleeding: Secondary | ICD-10-CM | POA: Diagnosis not present

## 2022-11-16 DIAGNOSIS — R0789 Other chest pain: Secondary | ICD-10-CM | POA: Diagnosis not present

## 2022-11-16 DIAGNOSIS — R079 Chest pain, unspecified: Secondary | ICD-10-CM | POA: Diagnosis not present

## 2022-11-16 LAB — HEPATIC FUNCTION PANEL
ALT: 19 U/L (ref 0–44)
AST: 19 U/L (ref 15–41)
Albumin: 4.8 g/dL (ref 3.5–5.0)
Alkaline Phosphatase: 51 U/L (ref 38–126)
Bilirubin, Direct: 0.1 mg/dL (ref 0.0–0.2)
Indirect Bilirubin: 0.8 mg/dL (ref 0.3–0.9)
Total Bilirubin: 0.9 mg/dL (ref 0.3–1.2)
Total Protein: 7.7 g/dL (ref 6.5–8.1)

## 2022-11-16 LAB — BASIC METABOLIC PANEL
Anion gap: 10 (ref 5–15)
BUN: 16 mg/dL (ref 6–20)
CO2: 22 mmol/L (ref 22–32)
Calcium: 9.8 mg/dL (ref 8.9–10.3)
Chloride: 104 mmol/L (ref 98–111)
Creatinine, Ser: 0.67 mg/dL (ref 0.44–1.00)
GFR, Estimated: >60 mL/min (ref >60)
Glucose, Bld: 87 mg/dL (ref 70–99)
Potassium: 3.4 mmol/L — ABNORMAL LOW (ref 3.5–5.1)
Sodium: 136 mmol/L (ref 135–145)

## 2022-11-16 LAB — CBC
HCT: 42.3 % (ref 36.0–46.0)
Hemoglobin: 14.1 g/dL (ref 12.0–15.0)
MCH: 29.2 pg (ref 26.0–34.0)
MCHC: 33.3 g/dL (ref 30.0–36.0)
MCV: 87.6 fL (ref 80.0–100.0)
Platelets: 347 10*3/uL (ref 150–400)
RBC: 4.83 MIL/uL (ref 3.87–5.11)
RDW: 11.3 % — ABNORMAL LOW (ref 11.5–15.5)
WBC: 10.6 10*3/uL — ABNORMAL HIGH (ref 4.0–10.5)
nRBC: 0 % (ref 0.0–0.2)

## 2022-11-16 LAB — POC URINE PREG, ED: Preg Test, Ur: NEGATIVE

## 2022-11-16 LAB — TROPONIN I (HIGH SENSITIVITY): Troponin I (High Sensitivity): <2 ng/L (ref <18)

## 2022-11-16 LAB — LIPASE, BLOOD: Lipase: 30 U/L (ref 11–51)

## 2022-11-16 MED ORDER — PANTOPRAZOLE SODIUM 40 MG PO TBEC: 40.0000 mg | DELAYED_RELEASE_TABLET | Freq: Every day | ORAL | 2 refills | Status: DC

## 2022-11-16 MED ORDER — ALUM & MAG HYDROXIDE-SIMETH 200-200-20 MG/5ML PO SUSP
30.0000 mL | Freq: Once | ORAL | Status: AC
  Administered 2022-11-16: 30 mL via ORAL
  Filled 2022-11-16: qty 30

## 2022-11-16 MED ORDER — LIDOCAINE VISCOUS HCL 2 % MT SOLN
15.0000 mL | Freq: Once | OROMUCOSAL | Status: AC
  Administered 2022-11-16: 15 mL via ORAL
  Filled 2022-11-16: qty 15

## 2022-11-16 NOTE — ED Notes (Signed)
Patient c/o increased burping this week, and is repeatedly burping on the stretcher today.

## 2022-11-16 NOTE — ED Notes (Signed)
Patient c/o discomfort in both shoulders, in thoracic back, left chest for 5 days intermittently.

## 2022-11-16 NOTE — ED Provider Notes (Signed)
[facility] Provider Note    Event Date/Time   First MD Initiated Contact with Patient 11/16/22 1749     (approximate)   History   Chief Complaint Chest Pain   HPI  Sandra Hayes is a 27 y.o. female with past medical history of eczema who presents to the ED complaining of chest pain.  Patient reports that she has been dealing with intermittent sharp pain in the left side of her chest for the past 6 days.  Pain seems to be worse when she eats and has been associated with frequent belching.  The pain seems to radiate down both of her arms, where she has some numbness and tingling.  She denies any weakness in her arms or legs.  She has not had any pain in her abdomen and denies any nausea, vomiting, or diarrhea.  She does admit to cocaine use prior to the onset of symptoms, states that was the first time she had tried it and she has not used it since then.     Physical Exam   Triage Vital Signs: ED Triage Vitals [11/16/22 1723]  Enc Vitals Group     BP 129/80     Pulse Rate 93     Resp 18     Temp 98.4 F (36.9 C)     Temp Source Oral     SpO2 99 %     Weight 112 lb (50.8 kg)     Height  (1.575 m)     Head Circumference      Peak Flow      Pain Score 0     Pain Loc      Pain Edu?      Excl. in GC?     Most recent vital signs: Vitals:   11/16/22 1723 11/16/22 1904  BP: 129/80 119/80  Pulse: 93 78  Resp: 18 16  Temp: 98.4 F (36.9 C)   SpO2: 99% 100%    Constitutional: Alert and oriented. Eyes: Conjunctivae are normal. Head: Atraumatic. Nose: No congestion/rhinnorhea. Mouth/Throat: Mucous membranes are moist.  Cardiovascular: Normal rate, regular rhythm. Grossly normal heart sounds.  2+ radial pulses bilaterally. Respiratory: Normal respiratory effort.  No retractions. Lungs CTAB.  No chest wall tenderness to palpation. Gastrointestinal: Soft and nontender. No distention. Musculoskeletal: No lower extremity tenderness nor  edema.  Neurologic:  Normal speech and language. No gross focal neurologic deficits are appreciated.    ED Results / Procedures / Treatments   Labs (all labs ordered are listed, but only abnormal results are displayed) Labs Reviewed  BASIC METABOLIC PANEL - Abnormal; Notable for the following components:      Result Value   Potassium 3.4 (*)    All other components within normal limits  CBC - Abnormal; Notable for the following components:   WBC 10.6 (*)    RDW 11.3 (*)    All other components within normal limits  HEPATIC FUNCTION PANEL  LIPASE, BLOOD  POC URINE PREG, ED  TROPONIN I (HIGH SENSITIVITY)     EKG  ED ECG REPORT I, Chesley Noon, the attending physician, personally viewed and interpreted this ECG.   Date: 11/16/2022  EKG Time: 17:23  Rate: 96  Rhythm: normal sinus rhythm  Axis: Normal  Intervals:none  ST&T Change: None  RADIOLOGY Chest x-ray reviewed and interpreted by me with no infiltrate, edema, or effusion.  PROCEDURES:  Critical Care performed: No  Procedures   MEDICATIONS ORDERED IN ED: Medications  alum & mag hydroxide-simeth (MAALOX/MYLANTA) 200-200-20 MG/5ML suspension 30 mL (30 mLs Oral Given 11/16/22 1816)    And  lidocaine (XYLOCAINE) 2 % viscous mouth solution 15 mL (15 mLs Oral Given 11/16/22 1816)     IMPRESSION / MDM / ASSESSMENT AND PLAN / ED COURSE  I reviewed the triage vital signs and the nursing notes.                              27 y.o. female with past medical history of eczema who presents to the ED complaining of intermittent chest discomfort associated with frequent belching, numbness and tingling in her extremities for the past 6 days.  Patient's presentation is most consistent with acute presentation with potential threat to life or bodily function.  Differential diagnosis includes, but is not limited to, ACS, PE, pneumonia, pneumothorax, GERD, musculoskeletal pain, anxiety, anemia, electrolyte  abnormality.  Patient nontoxic-appearing and in no acute distress, vital signs are unremarkable.  EKG shows no evidence of arrhythmia or ischemia and troponin within normal limits, doubt ACS given her atypical symptoms.  Chest x-ray is also unremarkable, remainder of labs are reassuring with no significant anemia, leukocytosis, lecture abnormality, or AKI.  LFTs and lipase are also unremarkable, no abdominal tenderness to suggest biliary process.  Pregnancy testing is negative.  Patient was treated with GI cocktail with improvement in symptoms, suspect GERD.  She is appropriate for outpatient management, counseled against illicit drug use and we will start her on a PPI.  She was counseled to follow-up with her PCP and to return to the ED for new or worsening symptoms, patient agrees with plan.      FINAL CLINICAL IMPRESSION(S) / ED DIAGNOSES   Final diagnoses:  Atypical chest pain  Gastroesophageal reflux disease with esophagitis without hemorrhage     Rx / DC Orders   ED Discharge Orders          Ordered    pantoprazole (PROTONIX) 40 MG tablet  Daily        11/16/22 1848             Note:  This document was prepared using Dragon voice recognition software and may include unintentional dictation errors.   Chesley Noon, MD 11/16/22 [phone]

## 2022-11-16 NOTE — ED Triage Notes (Signed)
Pt to ED via POV from [facility]. Pt states that she has been having cp and indigestion x1 week. Pt denies chest pain at this time. Pt states they notice the chest pain comes when she eats. Denies N/V

## 2022-11-23 NOTE — Progress Notes (Unsigned)
Name: Sandra Hayes   MRN: [id number]    DOB: February 11, 1996   Date:11/26/2022       Progress Note  Subjective  Chief Complaint  Sinus/ ER Follow-Up  HPI  GERD: she went to [facility] about 10 days ago with atypical chest pain, described as substernal burning and sometimes left side chest pain, no diaphoresis or associated sob, she was given PPI and states symptoms have improved and almost resolved. Discussed GERD appropriate diet  AR: she is not taking medications at this time. She has noticed post nasal drainage, facial pressure and intermittent left ear fullness  for over one month. No fever or chills.   Patient Active Problem List   Diagnosis Date Noted   Syncope and collapse 12/15/2015   Chronic constipation 01/10/2015   Dermatitis, eczematoid 01/10/2015   Perennial allergic rhinitis 01/10/2015   Vitamin D deficiency 01/10/2015   H/O urinary disorder 02/02/2008    Past Surgical History:  Procedure Laterality Date   cranial synostosis repair  2001   PEG TUBE PLACEMENT      Family History  Adopted: Yes    Social History   Tobacco Use   Smoking status: Never   Smokeless tobacco: Never  Substance Use Topics   Alcohol use: Yes    Alcohol/week: 3.0 standard drinks of alcohol    Types: 3 Standard drinks or equivalent per week     Current Outpatient Medications:    medroxyPROGESTERone (DEPO-PROVERA) 150 MG/ML injection, INJECT 1 ML (150 MG TOTAL) INTO THE MUSCLE EVERY 3 (THREE) MONTHS, Disp: 1 mL, Rfl: 2   pantoprazole (PROTONIX) 40 MG tablet, Take 1 tablet (40 mg total) by mouth daily., Disp: 30 tablet, Rfl: 2  No Known Allergies  I personally reviewed active problem list, medication list, allergies, family history, social history, health maintenance with the patient/caregiver today.   ROS  Ten systems reviewed and is negative except as mentioned in HPI    Objective  Vitals:   11/26/22 1429  BP: 134/66  Pulse: (!) 116  Resp: 16  SpO2: 99%  Weight: 112 lb (50.8  kg)  Height: 5\' 2"  (1.575 m)    Body mass index is 20.49 kg/m.  Physical Exam  Constitutional: Patient appears well-developed and well-nourished. No distress.  HEENT: head atraumatic, normocephalic, pupils equal and reactive to light, ears normal tm, neck supple, throat within normal limits Cardiovascular: Normal rate, regular rhythm and normal heart sounds.  No murmur heard. No BLE edema. Pulmonary/Chest: Effort normal and breath sounds normal. No respiratory distress. Abdominal: Soft.  There is no tenderness. Psychiatric: Patient has a normal mood and affect. behavior is normal. Judgment and thought content normal.   Recent Results (from the past 2160 hour(s))  Basic metabolic panel     Status: Abnormal   Collection Time: 11/16/22  5:20 PM  Result Value Ref Range   Sodium 136 135 - 145 mmol/L   Potassium 3.4 (L) 3.5 - 5.1 mmol/L   Chloride 104 98 - 111 mmol/L   CO2 22 22 - 32 mmol/L   Glucose, Bld 87 70 - 99 mg/dL    Comment: Glucose reference range applies only to samples taken after fasting for at least 8 hours.   BUN 16 6 - 20 mg/dL   Creatinine, Ser 5.62 0.44 - 1.00 mg/dL   Calcium 9.8 8.9 - 13.0 mg/dL   GFR, Estimated >86 >57 mL/min    Comment: (NOTE) Calculated using the CKD-EPI Creatinine Equation (2021)    Anion gap 10 5 -  15    Comment: Performed at [facility], [address] [place], [place] [address]  CBC     Status: Abnormal   Collection Time: 11/16/22  5:20 PM  Result Value Ref Range   WBC 10.6 (H) 4.0 - 10.5 K/uL   RBC 4.83 3.87 - 5.11 MIL/uL   Hemoglobin 14.1 12.0 - 15.0 g/dL   HCT 60.4 54.0 - 98.1 %   MCV 87.6 80.0 - 100.0 fL   MCH 29.2 26.0 - 34.0 pg   MCHC 33.3 30.0 - 36.0 g/dL   RDW 19.1 (L) 47.8 - 29.5 %   Platelets 347 150 - 400 K/uL   nRBC 0.0 0.0 - 0.2 %    Comment: Performed at [facility], [address], [place], [place] [address]  Troponin I (High Sensitivity)     Status: None   Collection Time: 11/16/22  5:20 PM   Result Value Ref Range   Troponin I (High Sensitivity) <2 <18 ng/L    Comment: (NOTE) Elevated high sensitivity troponin I (hsTnI) values and significant  changes across serial measurements may suggest ACS but many other  chronic and acute conditions are known to elevate hsTnI results.  Refer to the "Links" section for chest pain algorithms and additional  guidance. Performed at [facility], [address] [place], [place] [address]   Hepatic function panel     Status: None   Collection Time: 11/16/22  5:20 PM  Result Value Ref Range   Total Protein 7.7 6.5 - 8.1 g/dL   Albumin 4.8 3.5 - 5.0 g/dL   AST 19 15 - 41 U/L   ALT 19 0 - 44 U/L   Alkaline Phosphatase 51 38 - 126 U/L   Total Bilirubin 0.9 0.3 - 1.2 mg/dL   Bilirubin, Direct 0.1 0.0 - 0.2 mg/dL   Indirect Bilirubin 0.8 0.3 - 0.9 mg/dL    Comment: Performed at [facility], [address] [place], [place] [address]  Lipase, blood     Status: None   Collection Time: 11/16/22  5:20 PM  Result Value Ref Range   Lipase 30 11 - 51 U/L    Comment: Performed at [facility], [address] [place], [place] [address]  POC urine preg, ED     Status: None   Collection Time: 11/16/22  6:24 PM  Result Value Ref Range   Preg Test, Ur NEGATIVE NEGATIVE    Comment:        THE SENSITIVITY OF THIS METHODOLOGY IS >24 mIU/mL     PHQ2/9:    11/26/2022    2:30 PM 08/24/2022    3:12 PM 11/22/2021    3:53 PM 08/23/2021    2:52 PM 08/23/2020    3:53 PM  Depression screen PHQ 2/9  Decreased Interest 0 0 0 0 0  Down, Depressed, Hopeless 0 0 0 0 0  PHQ - 2 Score 0 0 0 0 0  Altered sleeping 0 0  0   Tired, decreased energy 0 1  0   Change in appetite 0 0  0   Feeling bad or failure about yourself  0 0  0   Trouble concentrating 0 0  0   Moving slowly or fidgety/restless 0 0  0   Suicidal thoughts 0 0  0   PHQ-9 Score 0 1  0   Difficult doing work/chores  Not difficult at all  Not difficult at all      phq  9 is negative   Fall Risk:    11/26/2022    2:30 PM 08/24/2022    3:11 PM 11/22/2021    3:53 PM 08/23/2021    2:52 PM 08/23/2020    3:53 PM  Fall Risk   Falls in the past year? 0 0 0 0 0  Number falls in past yr: 0   0 0  Injury with Fall? 0   0 0  Risk for fall due to : No Fall Risks No Fall Risks No Fall Risks    Follow up Falls prevention discussed Falls prevention discussed;Education provided;Falls evaluation completed Falls prevention discussed        Functional Status Survey: Is the patient deaf or have difficulty hearing?: No Does the patient have difficulty seeing, even when wearing glasses/contacts?: No Does the patient have difficulty concentrating, remembering, or making decisions?: No Does the patient have difficulty walking or climbing stairs?: No Does the patient have difficulty dressing or bathing?: No Does the patient have difficulty doing errands alone such as visiting a doctor's office or shopping?: No    Assessment & Plan  1. GERD without esophagitis  Continue PPI, change diet, return if no improvement, try to wean self off by taking every other day once you feel better   2. Perennial allergic rhinitis  - Azelastine-Fluticasone 137-50 MCG/ACT SUSP; Place 1 spray into the nose every 12 (twelve) hours.  Dispense: 23 g; Refill: 2 - levocetirizine (XYZAL) 2.5 MG/5ML solution; Take 5 mLs (2.5 mg total) by mouth every evening.  Dispense: 148 mL; Refill: 2

## 2022-11-26 ENCOUNTER — Ambulatory Visit: Payer: BC Managed Care – PPO | Admitting: Family Medicine

## 2022-11-26 ENCOUNTER — Encounter: Payer: Self-pay | Admitting: Family Medicine

## 2022-11-26 VITALS — BP 134/66 | HR 88 | Resp 16 | Ht 62.0 in | Wt 112.0 lb

## 2022-11-26 DIAGNOSIS — J3089 Other allergic rhinitis: Secondary | ICD-10-CM | POA: Diagnosis not present

## 2022-11-26 DIAGNOSIS — K219 Gastro-esophageal reflux disease without esophagitis: Secondary | ICD-10-CM

## 2022-11-26 MED ORDER — AZELASTINE-FLUTICASONE 137-50 MCG/ACT NA SUSP
1.0000 | Freq: Two times a day (BID) | NASAL | 2 refills | Status: DC
Start: 2022-11-26 — End: 2023-09-11

## 2022-11-26 MED ORDER — LEVOCETIRIZINE DIHYDROCHLORIDE 2.5 MG/5ML PO SOLN
2.5000 mg | Freq: Every evening | ORAL | 2 refills | Status: DC
Start: 2022-11-26 — End: 2023-02-25

## 2022-11-26 NOTE — Patient Instructions (Signed)
Food Choices for Gastroesophageal Reflux Disease, Adult When you have gastroesophageal reflux disease (GERD), the foods you eat and your eating habits are very important. Choosing the right foods can help ease the discomfort of GERD. Consider working with a dietitian to help you make healthy food choices. What are tips for following this plan? Reading food labels Look for foods that are low in saturated fat. Foods that have less than 5% of daily value (DV) of fat and 0 g of trans fats may help with your symptoms. Cooking Cook foods using methods other than frying. This may include baking, steaming, grilling, or broiling. These are all methods that do not need a lot of fat for cooking. To add flavor, try to use herbs that are low in spice and acidity. Meal planning  Choose healthy foods that are low in fat, such as fruits, vegetables, whole grains, low-fat dairy products, lean meats, fish, and poultry. Eat frequent, small meals instead of three large meals each day. Eat your meals slowly, in a relaxed setting. Avoid bending over or lying down until 2-3 hours after eating. Limit high-fat foods such as fatty meats or fried foods. Limit your intake of fatty foods, such as oils, butter, and shortening. Avoid the following as told by your health care provider: Foods that cause symptoms. These may be different for different people. Keep a food diary to keep track of foods that cause symptoms. Alcohol. Drinking large amounts of liquid with meals. Eating meals during the 2-3 hours before bed. Lifestyle Maintain a healthy weight. Ask your health care provider what weight is healthy for you. If you need to lose weight, work with your health care provider to do so safely. Exercise for at least 30 minutes on 5 or more days each week, or as told by your health care provider. Avoid wearing clothes that fit tightly around your waist and chest. Do not use any products that contain nicotine or tobacco. These  products include cigarettes, chewing tobacco, and vaping devices, such as e-cigarettes. If you need help quitting, ask your health care provider. Sleep with the head of your bed raised. Use a wedge under the mattress or blocks under the bed frame to raise the head of the bed. Chew sugar-free gum after mealtimes. What foods should I eat?  Eat a healthy, well-balanced diet of fruits, vegetables, whole grains, low-fat dairy products, lean meats, fish, and poultry. Each person is different. Foods that may trigger symptoms in one person may not trigger any symptoms in another person. Work with your health care provider to identify foods that are safe for you. The items listed above may not be a complete list of recommended foods and beverages. Contact a dietitian for more information. What foods should I avoid? Limiting some of these foods may help manage the symptoms of GERD. Everyone is different. Consult a dietitian or your health care provider to help you identify the exact foods to avoid, if any. Fruits Any fruits prepared with added fat. Any fruits that cause symptoms. For some people this may include citrus fruits, such as oranges, grapefruit, pineapple, and lemons. Vegetables Deep-fried vegetables. French fries. Any vegetables prepared with added fat. Any vegetables that cause symptoms. For some people, this may include tomatoes and tomato products, chili peppers, onions and garlic, and horseradish. Grains Pastries or quick breads with added fat. Meats and other proteins High-fat meats, such as fatty beef or pork, hot dogs, ribs, ham, sausage, salami, and bacon. Fried meat or protein, including   fried fish and fried chicken. Nuts and nut butters, in large amounts. Dairy Whole milk and chocolate milk. Sour cream. Cream. Ice cream. Cream cheese. Milkshakes. Fats and oils Butter. Margarine. Shortening. Ghee. Beverages Coffee and tea, with or without caffeine. Carbonated beverages. Sodas. Energy  drinks. Fruit juice made with acidic fruits, such as orange or grapefruit. Tomato juice. Alcoholic drinks. Sweets and desserts Chocolate and cocoa. Donuts. Seasonings and condiments Pepper. Peppermint and spearmint. Added salt. Any condiments, herbs, or seasonings that cause symptoms. For some people, this may include curry, hot sauce, or vinegar-based salad dressings. The items listed above may not be a complete list of foods and beverages to avoid. Contact a dietitian for more information. Questions to ask your health care provider Diet and lifestyle changes are usually the first steps that are taken to manage symptoms of GERD. If diet and lifestyle changes do not improve your symptoms, talk with your health care provider about taking medicines. Where to find more information International [employer] for Gastrointestinal Disorders: aboutgerd.org Summary When you have gastroesophageal reflux disease (GERD), food and lifestyle choices may be very helpful in easing the discomfort of GERD. Eat frequent, small meals instead of three large meals each day. Eat your meals slowly, in a relaxed setting. Avoid bending over or lying down until 2-3 hours after eating. Limit high-fat foods such as fatty meats or fried foods. This information is not intended to replace advice given to you by your health care provider. Make sure you discuss any questions you have with your health care provider. Document Revised: 01/25/2020 Document Reviewed: 01/25/2020 Elsevier Patient Education  2023 Elsevier Inc.  

## 2023-01-30 ENCOUNTER — Ambulatory Visit (INDEPENDENT_AMBULATORY_CARE_PROVIDER_SITE_OTHER): Payer: BC Managed Care – PPO

## 2023-01-30 DIAGNOSIS — Z3042 Encounter for surveillance of injectable contraceptive: Secondary | ICD-10-CM | POA: Diagnosis not present

## 2023-01-30 MED ORDER — MEDROXYPROGESTERONE ACETATE 104 MG/0.65ML ~~LOC~~ SUSY
104.0000 mg | PREFILLED_SYRINGE | Freq: Once | SUBCUTANEOUS | Status: DC
Start: 2023-01-30 — End: 2023-01-30

## 2023-01-30 MED ORDER — MEDROXYPROGESTERONE ACETATE 150 MG/ML IM SUSY
PREFILLED_SYRINGE | INTRAMUSCULAR | Status: AC
Start: 2023-01-30 — End: ?

## 2023-02-23 ENCOUNTER — Other Ambulatory Visit: Payer: Self-pay | Admitting: Family Medicine

## 2023-02-23 DIAGNOSIS — J3089 Other allergic rhinitis: Secondary | ICD-10-CM

## 2023-02-25 ENCOUNTER — Other Ambulatory Visit: Payer: Self-pay

## 2023-02-25 DIAGNOSIS — J3089 Other allergic rhinitis: Secondary | ICD-10-CM

## 2023-05-01 ENCOUNTER — Ambulatory Visit (INDEPENDENT_AMBULATORY_CARE_PROVIDER_SITE_OTHER): Payer: BC Managed Care – PPO

## 2023-05-01 DIAGNOSIS — Z3042 Encounter for surveillance of injectable contraceptive: Secondary | ICD-10-CM

## 2023-05-01 MED ORDER — MEDROXYPROGESTERONE ACETATE 150 MG/ML IM SUSY
150.0000 mg | PREFILLED_SYRINGE | INTRAMUSCULAR | Status: DC
Start: 2023-05-01 — End: 2023-09-11
  Administered 2023-05-01 – 2023-07-30 (×2): 150 mg via INTRAMUSCULAR

## 2023-07-18 ENCOUNTER — Other Ambulatory Visit: Payer: Self-pay | Admitting: Family Medicine

## 2023-07-18 DIAGNOSIS — Z3042 Encounter for surveillance of injectable contraceptive: Secondary | ICD-10-CM

## 2023-07-30 ENCOUNTER — Ambulatory Visit (INDEPENDENT_AMBULATORY_CARE_PROVIDER_SITE_OTHER): Payer: BC Managed Care – PPO

## 2023-07-30 DIAGNOSIS — Z3042 Encounter for surveillance of injectable contraceptive: Secondary | ICD-10-CM

## 2023-07-30 MED ORDER — METHYLPREDNISOLONE ACETATE 40 MG/ML IJ SUSP
40.0000 mg | Freq: Once | INTRAMUSCULAR | Status: DC
Start: 2023-07-30 — End: 2023-07-30

## 2023-08-26 ENCOUNTER — Encounter: Payer: BC Managed Care – PPO | Admitting: Family Medicine

## 2023-09-04 ENCOUNTER — Encounter: Payer: Self-pay | Admitting: Family Medicine

## 2023-09-06 ENCOUNTER — Encounter: Payer: BC Managed Care – PPO | Admitting: Family Medicine

## 2023-09-11 ENCOUNTER — Ambulatory Visit (INDEPENDENT_AMBULATORY_CARE_PROVIDER_SITE_OTHER): Payer: BC Managed Care – PPO | Admitting: Family Medicine

## 2023-09-11 ENCOUNTER — Other Ambulatory Visit (HOSPITAL_COMMUNITY)
Admission: RE | Admit: 2023-09-11 | Discharge: 2023-09-11 | Disposition: A | Discharge status: Home / Self Care | Payer: BC Managed Care – PPO | Source: Ambulatory Visit | Attending: Family Medicine | Admitting: Family Medicine

## 2023-09-11 ENCOUNTER — Encounter: Payer: Self-pay | Admitting: Family Medicine

## 2023-09-11 VITALS — BP 114/70 | HR 69 | Temp 98.2°F | Resp 16 | Ht 62.0 in | Wt 122.5 lb

## 2023-09-11 DIAGNOSIS — E559 Vitamin D deficiency, unspecified: Secondary | ICD-10-CM

## 2023-09-11 DIAGNOSIS — Z0001 Encounter for general adult medical examination with abnormal findings: Secondary | ICD-10-CM

## 2023-09-11 DIAGNOSIS — R8290 Unspecified abnormal findings in urine: Secondary | ICD-10-CM

## 2023-09-11 DIAGNOSIS — Z131 Encounter for screening for diabetes mellitus: Secondary | ICD-10-CM

## 2023-09-11 DIAGNOSIS — Z13220 Encounter for screening for lipoid disorders: Secondary | ICD-10-CM | POA: Diagnosis not present

## 2023-09-11 DIAGNOSIS — Z23 Encounter for immunization: Secondary | ICD-10-CM

## 2023-09-11 DIAGNOSIS — Z113 Encounter for screening for infections with a predominantly sexual mode of transmission: Secondary | ICD-10-CM | POA: Insufficient documentation

## 2023-09-11 DIAGNOSIS — Z0000 Encounter for general adult medical examination without abnormal findings: Secondary | ICD-10-CM | POA: Insufficient documentation

## 2023-09-11 DIAGNOSIS — Z130 Encounter for screening for diseases of the blood and blood-forming organs and certain disorders involving the immune mechanism: Secondary | ICD-10-CM

## 2023-09-11 NOTE — Progress Notes (Signed)
Name: Eyleen Rawlinson [facility]   MRN: [id number]    DOB: 06-03-1996   Date:09/11/2023       Progress Note  Subjective  Chief Complaint  Chief Complaint  Patient presents with   Annual Exam    HPI  Patient presents for annual CPE.  Diet: cooks at home, vegetables but not a lot of fruit, beans and rice Exercise: discussed exercise outside of work  Last Eye Exam:  advised to go at least every few years Last Dental Exam:  she will schedule it   [employer] [facility]11[facility] [address]  AUDIT-C Score 0      Depression: Phq 9 is  positive - seasonal - cold and dark    09/11/2023    8:05 AM 11/26/2022    2:30 PM 08/24/2022    3:12 PM 11/22/2021    3:53 PM 08/23/2021    2:52 PM  Depression screen PHQ 2/9  Decreased Interest 1 0 0 0 0  Down, Depressed, Hopeless 1 0 0 0 0  PHQ - 2 Score 2 0 0 0 0  Altered sleeping 1 0 0  0  Tired, decreased energy 1 0 1  0  Change in appetite 0 0 0  0  Feeling bad or failure about yourself  0 0 0  0  Trouble concentrating 1 0 0  0  Moving slowly or fidgety/restless 0 0 0  0  Suicidal thoughts 0 0 0  0  PHQ-9 Score 5 0 1  0  Difficult doing work/chores Somewhat difficult  Not difficult at all  Not difficult at all   Hypertension: BP Readings from Last 3 Encounters:  09/11/23 114/70  11/26/22 134/66  11/16/22 119/80   Obesity: Wt Readings from Last 3 Encounters:  09/11/23 122 lb 8 oz (55.6 kg)  11/26/22 112 lb (50.8 kg)  11/16/22 112 lb (50.8 kg)   BMI Readings from Last 3 Encounters:  09/11/23 22.41 kg/m  11/26/22 20.49 kg/m  11/16/22 20.49 kg/m     Vaccines: reviewed with the patient.   Hep C Screening: completed STD testing and prevention (HIV/chl/gon/syphilis): we will recheck it today  Intimate partner violence: negative screen  Sexual History : yes, uses condoms , also Depo  Menstrual History/LMP/Abnormal Bleeding: no periods, on Depo for years  Discussed importance of follow up if any  post-menopausal bleeding: not applicable  Incontinence Symptoms: negative for symptoms   Breast cancer:  - Last Mammogram: N/A - BRCA gene screening:   Osteoporosis Prevention : Discussed high calcium and vitamin D supplementation, weight bearing exercises Bone density :not applicable   Cervical cancer screening: performing today  Skin cancer: Discussed monitoring for atypical lesions  Colorectal cancer: N/A   Lung cancer:  Low Dose CT Chest recommended if Age 24-80 years, 20 pack-year currently smoking OR have quit w/in 15years. Patient does not qualify for screen   ECG: 2024  Advanced Care Planning: A voluntary discussion about advance care planning including the explanation and discussion of advance directives.  Discussed health care proxy and Living will, and the patient was able to identify a health care proxy as mother .  Patient does not have a living will and power of attorney of health care   Patient Active Problem List   Diagnosis Date Noted   Syncope and collapse 12/15/2015   Chronic constipation 01/10/2015   Dermatitis, eczematoid 01/10/2015   Perennial allergic rhinitis 01/10/2015   Vitamin D deficiency 01/10/2015   H/O  urinary disorder 02/02/2008    Past Surgical History:  Procedure Laterality Date   cranial synostosis repair  2001   PEG TUBE PLACEMENT      Family History  Adopted: Yes    Social History   Socioeconomic History   Marital status: Media planner    Spouse name: Not on file   Number of children: 0   Years of education: 16   Highest education level: Some college, no degree  Occupational History   Occupation: Doctor, hospital   Tobacco Use   Smoking status: Never   Smokeless tobacco: Never  Vaping Use   Vaping status: Never Used  Substance and Sexual Activity   Alcohol use: Yes    Alcohol/week: 3.0 standard drinks of alcohol    Types: 3 Standard drinks or equivalent per week   Drug use: Yes    Frequency: 2.0 times per week     Types: Marijuana   Sexual activity: Yes    Partners: Male    Birth control/protection: Condom, Injection  Other Topics Concern   Not on file  Social History Narrative   Graduated [employer] 2021    Unable to get into [employer] school -  wants to do Speech pathology at [employer] , however now thinking about being an Geophysicist/field seismologist    She is helping her mother raise her little sister, she keeps her in the evenings, boyfriend moved out   Social Drivers of Corporate investment banker Strain: Low Risk  (09/11/2023)   Overall Financial Resource Strain (CARDIA)    Difficulty of Paying Living Expenses: Not hard at all  Food Insecurity: No Food Insecurity (09/11/2023)   Hunger Vital Sign    Worried About Running Out of Food in the Last Year: Never true    Ran Out of Food in the Last Year: Never true  Transportation Needs: No Transportation Needs (09/11/2023)   PRAPARE - Administrator, Civil Service (Medical): No    Lack of Transportation (Non-Medical): No  Physical Activity: Sufficiently Active (09/11/2023)   Exercise Vital Sign    Days of Exercise per Week: 5 days    Minutes of Exercise per Session: 30 min  Stress: No Stress Concern Present (09/11/2023)   [employer] of Occupational Health - Occupational Stress Questionnaire    Feeling of Stress : Only a little  Social Connections: Socially Isolated (09/11/2023)   Social Connection and Isolation Panel [NHANES]    Frequency of Communication with Friends and Family: More than three times a week    Frequency of Social Gatherings with Friends and Family: More than three times a week    Attends Religious Services: Never    Database administrator or Organizations: No    Attends Banker Meetings: Never    Marital Status: Never married  Intimate Partner Violence: Not At Risk (09/11/2023)   Humiliation, Afraid, Rape, and Kick questionnaire    Fear of Current or Ex-Partner: No    Emotionally Abused: No     Physically Abused: No    Sexually Abused: No     Current Outpatient Medications:    medroxyPROGESTERone (DEPO-PROVERA) 150 MG/ML injection, INJECT 1 ML (150 MG TOTAL) INTO THE MUSCLE EVERY 3 (THREE) MONTHS, Disp: 1 mL, Rfl: 0  Current Facility-Administered Medications:    medroxyPROGESTERone Acetate SUSY, , Intramuscular, Q90 days, Margarita Mail, DO, Given at 01/30/23 0820  No Known Allergies   ROS  Constitutional: Negative for fever or weight change.  Respiratory:  Negative for cough and shortness of breath.   Cardiovascular: Negative for chest pain or palpitations.  Gastrointestinal: Negative for abdominal pain, no bowel changes.  Musculoskeletal: Negative for gait problem or joint swelling.  Skin: Negative for rash.  Neurological: Negative for dizziness or headache.  No other specific complaints in a complete review of systems (except as listed in HPI above).   Objective  Vitals:   09/11/23 0812  BP: 114/70  Pulse: 69  Resp: 16  Temp: 98.2 F (36.8 C)  TempSrc: Oral  SpO2: 96%  Weight: 122 lb 8 oz (55.6 kg)  Height: 5\' 2"  (1.575 m)    Body mass index is 22.41 kg/m.  Physical Exam  Constitutional: Patient appears well-developed and well-nourished. No distress.  HENT: Head: Normocephalic and atraumatic. Ears: B TMs ok, no erythema or effusion; Nose: Nose normal. Mouth/Throat: Oropharynx is clear and moist. No oropharyngeal exudate.  Eyes: Conjunctivae and EOM are normal. Pupils are equal, round, and reactive to light. No scleral icterus.  Neck: Normal range of motion. Neck supple. No JVD present. No thyromegaly present.  Cardiovascular: Normal rate, regular rhythm and normal heart sounds.  No murmur heard. No BLE edema. Pulmonary/Chest: Effort normal and breath sounds normal. No respiratory distress. Abdominal: Soft. Bowel sounds are normal, no distension. There is no tenderness. no masses Breast: no lumps or masses, no nipple discharge or rashes FEMALE  GENITALIA:  External genitalia normal External urethra normal Vaginal vault normal without discharge or lesions Cervix normal without discharge or lesions Bimanual exam normal without masses RECTAL: not done  Musculoskeletal: Normal range of motion, no joint effusions. No gross deformities Neurological: he is alert and oriented to person, place, and time. No cranial nerve deficit. Coordination, balance, strength, speech and gait are normal.  Skin: Skin is warm and dry. No rash noted. No erythema.  Psychiatric: Patient has a normal mood and affect. behavior is normal. Judgment and thought content normal.     Assessment & Plan  1. Well adult exam (Primary)  - Cytology - PAP - Lipid panel - CBC with Differential/Platelet - COMPLETE METABOLIC PANEL WITH GFR - Hemoglobin A1c - VITAMIN D 25 Hydroxy (Vit-D Deficiency, Fractures)  2. Screen for STD (sexually transmitted disease)  HIV, RPR  3. Vitamin D deficiency  - VITAMIN D 25 Hydroxy (Vit-D Deficiency, Fractures)  4. Diabetes mellitus screening  - Hemoglobin A1c  5. Lipid screening  - Lipid panel  6. Screening for iron deficiency anemia  - CBC with Differential/Platelet  7. Bad odor of urine  - CULTURE, URINE COMPREHENSIVE  8. Need for influenza vaccination  - Flu vaccine trivalent PF, 6mos and older(Flulaval,Afluria,Fluarix,Fluzone)    -USPSTF grade A and B recommendations reviewed with patient; age-appropriate recommendations, preventive care, screening tests, etc discussed and encouraged; healthy living encouraged; see AVS for patient education given to patient -Discussed importance of 150 minutes of physical activity weekly, eat two servings of fish weekly, eat one serving of tree nuts ( cashews, pistachios, pecans, almonds.Marland Kitchen) every other day, eat 6 servings of fruit/vegetables daily and drink plenty of water and avoid sweet beverages.   -Reviewed Health Maintenance: Yes.

## 2023-09-13 LAB — LIPID PANEL
Cholesterol: 153 mg/dL (ref <200)
HDL: 67 mg/dL (ref >=50)
LDL Cholesterol (Calc): 73 mg/dL
Non-HDL Cholesterol (Calc): 86 mg/dL (ref <130)
Total CHOL/HDL Ratio: 2.3 (calc) (ref <5.0)
Triglycerides: 47 mg/dL (ref <150)

## 2023-09-13 LAB — HEMOGLOBIN A1C
Hgb A1c MFr Bld: 5 %{Hb} (ref <5.7)
Mean Plasma Glucose: 97 mg/dL
eAG (mmol/L): 5.4 mmol/L

## 2023-09-13 LAB — CBC WITH DIFFERENTIAL/PLATELET
Absolute Lymphocytes: 3310 {cells}/uL (ref 850–3900)
Absolute Monocytes: 655 {cells}/uL (ref 200–950)
Basophils Absolute: 67 {cells}/uL (ref 0–200)
Basophils Relative: 0.8 %
Eosinophils Absolute: 84 {cells}/uL (ref 15–500)
Eosinophils Relative: 1 %
HCT: 41 % (ref 35.0–45.0)
Hemoglobin: 13.8 g/dL (ref 11.7–15.5)
MCH: 29.7 pg (ref 27.0–33.0)
MCHC: 33.7 g/dL (ref 32.0–36.0)
MCV: 88.4 fL (ref 80.0–100.0)
MPV: 11.1 fL (ref 7.5–12.5)
Monocytes Relative: 7.8 %
Neutro Abs: 4284 {cells}/uL (ref 1500–7800)
Neutrophils Relative %: 51 %
Platelets: 322 10*3/uL (ref 140–400)
RBC: 4.64 10*6/uL (ref 3.80–5.10)
RDW: 11.2 % (ref 11.0–15.0)
Total Lymphocyte: 39.4 %
WBC: 8.4 10*3/uL (ref 3.8–10.8)

## 2023-09-13 LAB — CULTURE, URINE COMPREHENSIVE
MICRO NUMBER:: 16076461
RESULT:: NO GROWTH
SPECIMEN QUALITY:: ADEQUATE

## 2023-09-13 LAB — COMPLETE METABOLIC PANEL WITH GFR
AG Ratio: 2 (calc) (ref 1.0–2.5)
ALT: 14 U/L (ref 6–29)
AST: 16 U/L (ref 10–30)
Albumin: 4.9 g/dL (ref 3.6–5.1)
Alkaline phosphatase (APISO): 54 U/L (ref 31–125)
BUN: 12 mg/dL (ref 7–25)
CO2: 24 mmol/L (ref 20–32)
Calcium: 10 mg/dL (ref 8.6–10.2)
Chloride: 105 mmol/L (ref 98–110)
Creat: 0.72 mg/dL (ref 0.50–0.96)
Globulin: 2.4 g/dL (ref 1.9–3.7)
Glucose, Bld: 93 mg/dL (ref 65–99)
Potassium: 4.3 mmol/L (ref 3.5–5.3)
Sodium: 138 mmol/L (ref 135–146)
Total Bilirubin: 0.4 mg/dL (ref 0.2–1.2)
Total Protein: 7.3 g/dL (ref 6.1–8.1)
eGFR: 119 mL/min/{1.73_m2} (ref >=60)

## 2023-09-13 LAB — VITAMIN D 25 HYDROXY (VIT D DEFICIENCY, FRACTURES): Vit D, 25-Hydroxy: 16 ng/mL — ABNORMAL LOW (ref 30–100)

## 2023-09-13 LAB — HIV ANTIBODY (ROUTINE TESTING W REFLEX): HIV 1&2 Ab, 4th Generation: NONREACTIVE

## 2023-09-13 LAB — RPR: RPR Ser Ql: NONREACTIVE

## 2023-09-16 ENCOUNTER — Encounter: Payer: Self-pay | Admitting: Family Medicine

## 2023-09-16 LAB — CYTOLOGY - PAP
Chlamydia: NEGATIVE
Comment: NEGATIVE
Comment: NORMAL
Diagnosis: NEGATIVE
Neisseria Gonorrhea: NEGATIVE

## 2023-10-22 ENCOUNTER — Other Ambulatory Visit: Payer: Self-pay | Admitting: Family Medicine

## 2023-10-22 DIAGNOSIS — Z3042 Encounter for surveillance of injectable contraceptive: Secondary | ICD-10-CM

## 2023-11-05 ENCOUNTER — Ambulatory Visit (INDEPENDENT_AMBULATORY_CARE_PROVIDER_SITE_OTHER)

## 2023-11-05 DIAGNOSIS — Z3042 Encounter for surveillance of injectable contraceptive: Secondary | ICD-10-CM

## 2024-01-30 ENCOUNTER — Ambulatory Visit

## 2024-01-30 DIAGNOSIS — Z3042 Encounter for surveillance of injectable contraceptive: Secondary | ICD-10-CM

## 2024-02-04 ENCOUNTER — Ambulatory Visit

## 2024-04-25 ENCOUNTER — Other Ambulatory Visit: Payer: Self-pay | Admitting: Family Medicine

## 2024-04-25 DIAGNOSIS — Z3042 Encounter for surveillance of injectable contraceptive: Secondary | ICD-10-CM

## 2024-09-11 ENCOUNTER — Encounter: Payer: BC Managed Care – PPO | Admitting: Family Medicine
